# Patient Record
Sex: Male | Born: 1965 | Race: White | Hispanic: No | Marital: Single | Smoking: Current every day smoker
Health system: Southern US, Community
[De-identification: ages and names within clinical notes are randomized; demographics above are authoritative.]

## PROBLEM LIST (undated history)

## (undated) DIAGNOSIS — E785 Hyperlipidemia, unspecified: Secondary | ICD-10-CM

## (undated) DIAGNOSIS — I10 Essential (primary) hypertension: Secondary | ICD-10-CM

## (undated) DIAGNOSIS — F17210 Nicotine dependence, cigarettes, uncomplicated: Secondary | ICD-10-CM

## (undated) DIAGNOSIS — I714 Abdominal aortic aneurysm, without rupture, unspecified: Secondary | ICD-10-CM

## (undated) DIAGNOSIS — I745 Embolism and thrombosis of iliac artery: Secondary | ICD-10-CM

## (undated) DIAGNOSIS — I77810 Thoracic aortic ectasia: Secondary | ICD-10-CM

## (undated) DIAGNOSIS — Z9889 Other specified postprocedural states: Secondary | ICD-10-CM

## (undated) DIAGNOSIS — J449 Chronic obstructive pulmonary disease, unspecified: Secondary | ICD-10-CM

## (undated) DIAGNOSIS — K5720 Diverticulitis of large intestine with perforation and abscess without bleeding: Secondary | ICD-10-CM

## (undated) DIAGNOSIS — K760 Fatty (change of) liver, not elsewhere classified: Secondary | ICD-10-CM

## (undated) DIAGNOSIS — Z933 Colostomy status: Secondary | ICD-10-CM

## (undated) DIAGNOSIS — I7143 Infrarenal abdominal aortic aneurysm, without rupture: Secondary | ICD-10-CM

## (undated) DIAGNOSIS — K635 Polyp of colon: Secondary | ICD-10-CM

## (undated) HISTORY — DX: Hyperlipidemia, unspecified: E78.5

---

## 2005-04-29 ENCOUNTER — Inpatient Hospital Stay (HOSPITAL_COMMUNITY): Admission: EM | Admit: 2005-04-29 | Discharge: 2005-05-01 | Payer: Self-pay | Admitting: Emergency Medicine

## 2006-08-09 IMAGING — CR DG ANKLE COMPLETE 3+V*R*
3 series · 3 of 3 positions shown · non-contrast
Comparison: none

CLINICAL DATA: Fall.  Right ankle twisting injury with severe pain and swelling. 
RIGHT ANKLE ? 3 VIEW:

[view not recorded (1 of 3)]
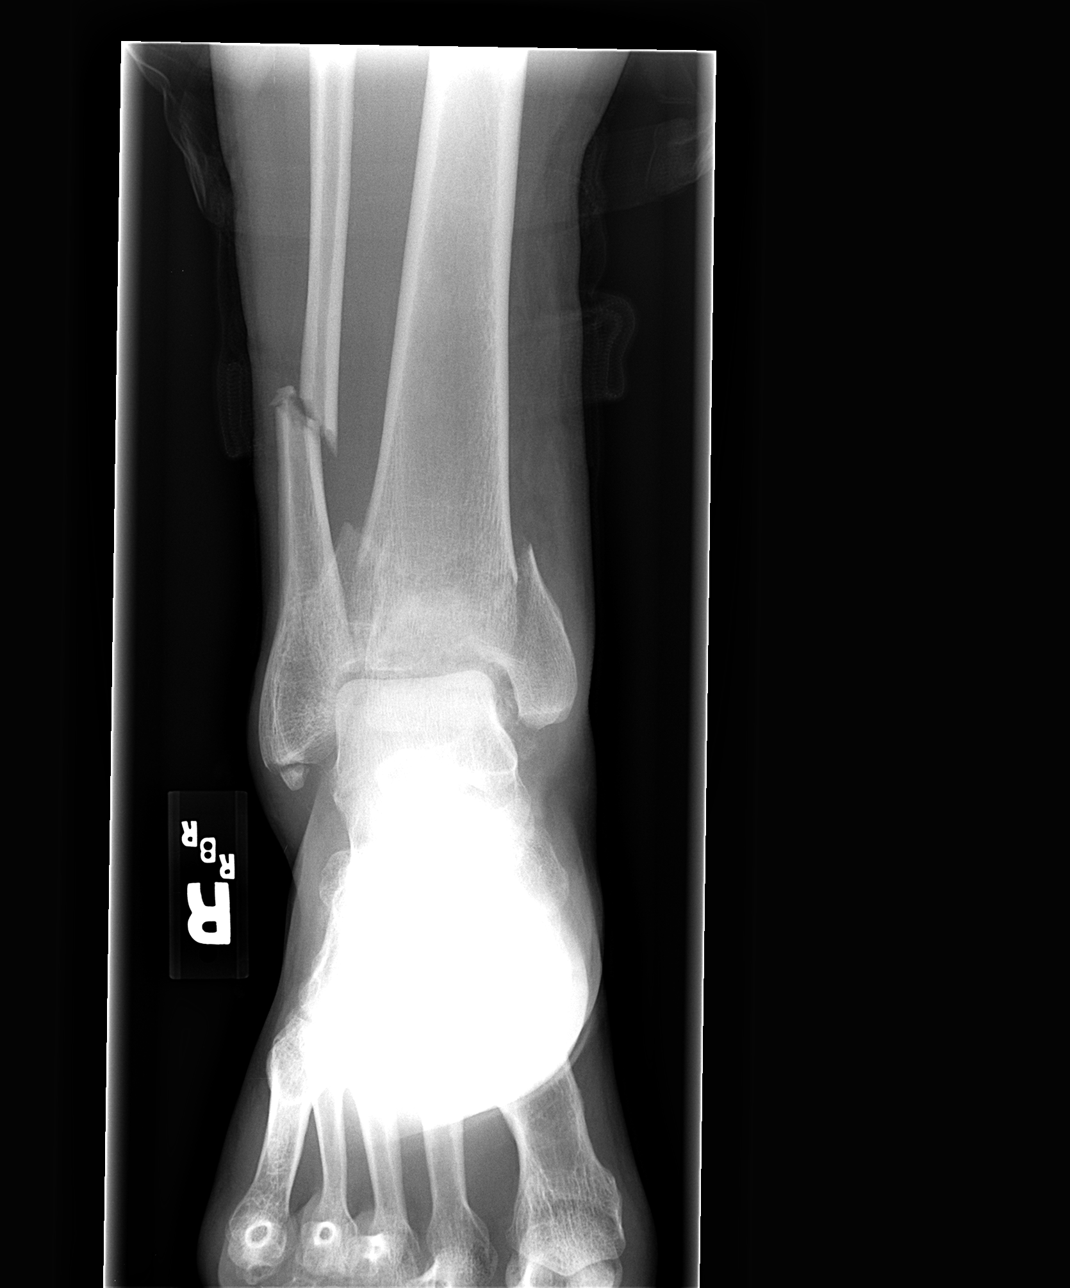

[view not recorded (2 of 3)]
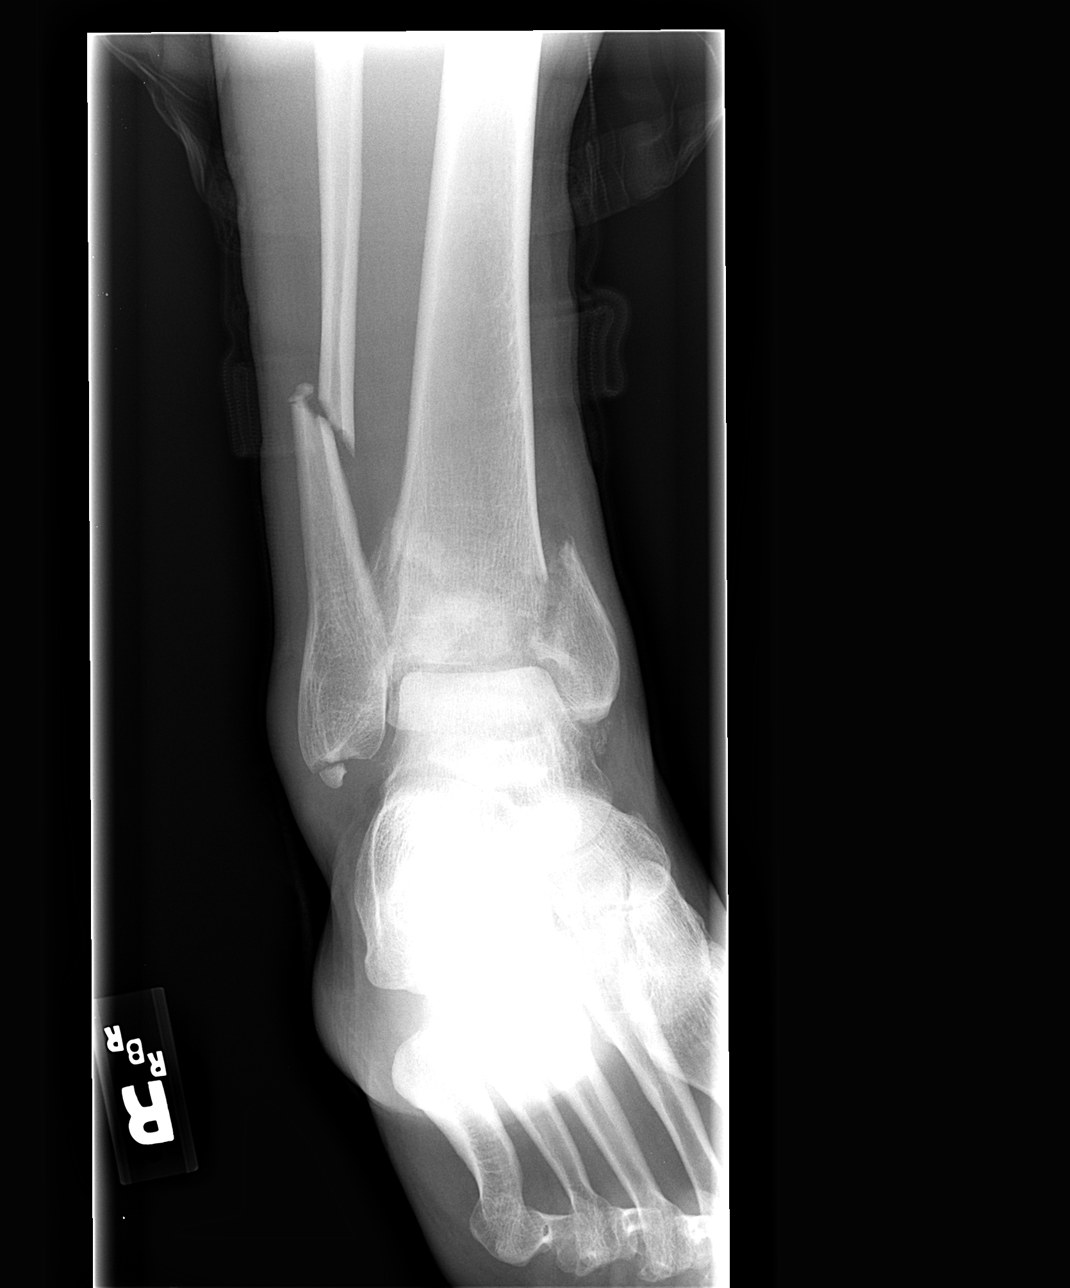

[view not recorded (3 of 3)]
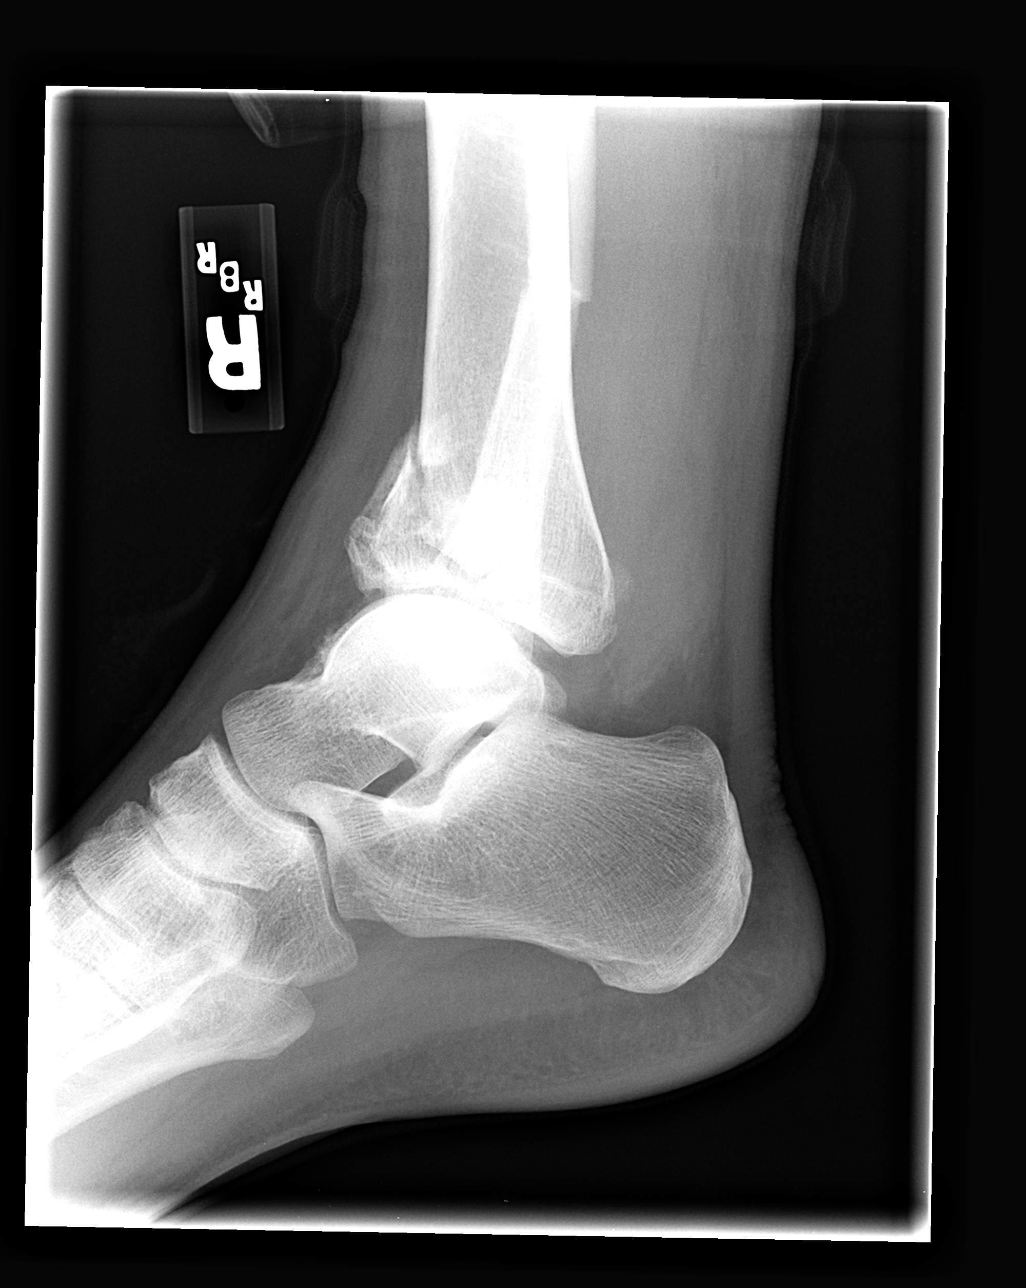

[3 of 3 positions shown; findings below may reference images not displayed]

FINDINGS: Impacted trimalleolar ankle fracture is seen with depression of the anterior articular surface of the distal tibia.  There is also an oblique fracture of the distal fibular shaft approximately 5 cm above the level of the tibial plafond with lateral displacement and anterior and medial angulation of the distal fibular fracture fragment.
IMPRESSION: 1.  Trimalleolar ankle fracture with impaction of the anterior articular surface of the distal tibia. 
2.  Oblique fracture of the distal fibula, approximately 5 cm superior to the tibial plafond.
RIGHT TIBIA/FIBULA ? 2 VIEW:
FINDINGS: A trimalleolar ankle fracture is seen.  There is also an oblique fracture of the distal fibular shaft approximately 5 cm above the level of the tibial plafond.
IMPRESSION: 1.  Oblique fracture of the distal fibula approximately 5 cm above level of the tibial plafond.
2.  Impacted trimalleolar ankle fracture.

## 2007-11-05 ENCOUNTER — Emergency Department (HOSPITAL_COMMUNITY): Admission: EM | Admit: 2007-11-05 | Discharge: 2007-11-05 | Payer: Self-pay | Admitting: Emergency Medicine

## 2010-09-30 NOTE — Op Note (Signed)
NAMEMarland Kitchen  NORI, POLAND NO.:  [id number]   MEDICAL RECORD NO.:  [id number]          PATIENT TYPE:  INP   LOCATION:  1827                         FACILITY:  [facility]   PHYSICIAN:  Claude Manges. Whitfield, M.D.DATE OF BIRTH:  1966/05/15   DATE OF PROCEDURE:  04/29/2005  DATE OF DISCHARGE:                                 OPERATIVE REPORT   PREOPERATIVE DIAGNOSIS:  Closed displaced right distal fibular fracture with  associated displaced impacted intra-articular fracture of the distal right  tibia and displaced medial malleolus fracture.   POSTOPERATIVE DIAGNOSIS:  Closed displaced right distal fibular fracture  with associated displaced impacted intra-articular fracture of the distal  right tibia and displaced medial malleolus fracture.   PROCEDURE:  Open reduction and internal fixation with bone grafting of  distal tibia.   SURGEON:  Claude Manges. Cleophas Dunker, M.D.   ASSISTANT:  Richardean Canal, P.A.   ANESTHESIA:  General orotracheal.   COMPLICATIONS:  None.   DESCRIPTION OF PROCEDURE:  With the patient comfortable on the operating  table and under general orotracheal anesthesia, the patient was placed in a  proximal right thigh tourniquet.  The leg was then prepped from the tips of  the toes to the knee with Betadine scrub and then DuraPrep.  Sterile draping  was performed.  With the extremity still elevated, it was Esmarch-  exsanguinated with a proximal tourniquet at 350 mmHg.   X-rays revealed a fracture of the distal fibula, perhaps 5 inches or so  proximal to the lateral malleolus.  There was also a very small avulsion  fracture of the lateral malleolus.  There was a displaced sheer fracture of  the medial malleolus and an intra-articular impaction fracture of the distal  tibia.   We first approached the lateral malleolus with a longitudinal incision  behind the lateral malleolus and via sharp dissection, carried down through  the subcutaneous tissue and via blunt  dissection, the soft tissue was  elevated off the fibula.  The periosteum was then elevated with a Glorious Peach so  we could visualize the fracture; it was short, oblique and comminuted.  Placing bone clamps on either side of the fracture and with distal traction,  it was reduced in anatomic position.  The anatomy of the fibula was such  that it was oblique in several planes and there was no flat edge and so  accordingly, I used a one-third tubular plate so that I could semi-bend the  plate to conform to the shape of the fibula; a 7-hole plate was utilized.  Four holes were placed above the fracture and 3 below after appropriate  drilling, measuring and filling with self-tapping cortical screws.  I had  very nice position and in fact, it was barely visible by image  intensification.  Wound was irrigated with saline solution.  Fascia was  closed with 3-0 Vicryl, subcu with the same material, skin closed with skin  clips.   A longitudinal incision was then made more than 7 cm in distance from the  lateral incision, starting along the distal tibia anteromedially, extending  more medially  over the medial malleolus.  Via sharp dissection, the incision  was carried down to the subcutaneous tissue.  Blunt dissection was then  performed.  We could visualize the sheer fracture of the medial malleolus;  this was retracted out of the operative field so that we could then  visualize the ankle joint.  The anterior half of the distal tibial articular  surface had been impacted and displaced perhaps at least a centimeter.  On  further visualization, there was an area of complete loss of articular  cartilage on the talus in a transverse fashion that was probably a  centimeter and a half to 2 cm in length.  The cartilage was eventually found  impacted in the cancellous bone of the distal tibia.  There was also a  displaced fracture of the anterolateral distal tibia.  Subperiosteal  dissection was performed  beneath the anterior skin flap so that we could  visualize the joint; it was irrigated for better visualization.  Using a  series of instruments, the impacted distal tibia was then disimpacted and  then placed anatomically over the talus, leaving a significant void of  cancellous bone and even cortical bone which had been displaced more  proximally.  A small stab wound was made anterolaterally and in protecting  the soft tissue, several guidepins were then inserted and cancellous screws  placed over the anterolateral tibial cortical bone to maintain good  position; I thought I had very nice position with image intensification.  The medial malleolar sheer fracture was then fixed with a K-wire and then 2  cortical screws with washers were then utilized after appropriate drilling  and measuring with the image intensification.  I had good purchase.  I had  difficulty fixing the lateral meniscus because there was little if any bone  in the very central position of the tibia to affix the screws and even the  tibial cortex laterally was comminuted, but I thought I had very nice  position.  I did leave the K-wire in place and clipped it at the cortical  margin, as I thought this was providing some stability.  The impacted distal  tibial bone that was devoid of cancellous bone was then filled with  cancellous allograft bone chips and then they were impacted, and then the  cortical bone that had been displaced from the original tibia was impacted  in place.  I irrigated the wound copiously with saline before this.   The tourniquet was deflated at about 2 hours and 10 minutes.  There was  immediate capillary refill to the toes and to all the skin edges.  The  medial wound was then closed with 3-0 Vicryl and then skin clips.  A sterile  bulky dressing was applied followed by posterior splints and coaptation  splints.  The patient tolerated the procedure well without complications.      Claude Manges.  Cleophas Dunker, M.D.  Electronically Signed     PWW/MEDQ  D:  04/29/2005  T:  05/02/2005  Job:  [id number]

## 2010-09-30 NOTE — Discharge Summary (Signed)
NAMEMarland Villarreal  FARZAD, TIBBETTS NO.:  [id number]   MEDICAL RECORD NO.:  [id number]          PATIENT TYPE:  INP   LOCATION:  5037                         FACILITY:  [facility]   PHYSICIAN:  Claude Manges. Whitfield, M.D.DATE OF BIRTH:  [birth year]   DATE OF ADMISSION:  04/29/2005  DATE OF DISCHARGE:  05/01/2005                                 DISCHARGE SUMMARY   ADMISSION DIAGNOSES:  1.  Right trimalleolar ankle fracture.  2.  Tobacco abuse.   DISCHARGE DIAGNOSES:  1.  Right trimalleolar ankle fracture.  2.  Tobacco abuse.   SURGICAL PROCEDURE:  On April 29, 2005, Jeremiah Villarreal underwent an open  reduction and internal fixation of a displaced distal right fibular fracture  with intra-articular displaced fracture off of the distal tibia and a  displaced medial malleolus fracture by Dr. Claude Manges. Whitfield, assisted by  Richardean Canal, P.A.-C.   COMPLICATIONS:  None.   CONSULTS:  1.  Cardiology consult was obtained on April 29, 2005.  2.  Physical Therapy and Occupational Therapy consults on April 29, 2005      and May 01, 2005.   HISTORY OF PRESENT ILLNESS:  This 45 year old white male fell when he was  trying to get into the shower 3 hours prior to admission.  He complained of  immediate right ankle pain and inability to bear weight.  He was brought to  the ER and found to have a displaced comminuted trimalleolar ankle fracture  with anterior subluxation of the talus and intra-articular fracture  extension.  The patient was admitted for surgical fixation.   HOSPITAL COURSE:  Jeremiah Villarreal tolerated the surgical procedure well without  immediate postoperative complications.  Preop cardiology consult was  obtained due to EKG reading that was problematic.  Repeat EKG was negative,  all cardiac enzymes were negative and he had no cardiac issues.  They did  sign off at that time.  On postop day 1, he was started on therapy per  protocol.  __________  all vitals stable.  He did  well.  On postop day 2, he  was ready for discharge home, ambulating non-weightbearing on that right leg  with use of crutches.  He will be discharged home later today.   DISCHARGE INSTRUCTIONS:   DIET:  He can resume his regular pre-hospitalization diet.   MEDICATIONS:  1.  Percocet 5/325 mg one to two tablets p.o. q.4 h. p.r.n. for pain, 50      with no refill.  2.  Robaxin 500 mg one to two tablets p.o. q.6 h. p.r.n. for spasm, 45 with      no refill.  3.  One baby aspirin a day for 6 weeks.  4.  He is encouraged to limit his smoking as much as possible to help with      fracture healing.   ACTIVITY:  He can be out of bed non-weightbearing on the right leg with the  use of crutches.  He is to keep his splint clean and dry, and not to take  any showers.  He is to elevate that  right leg above his heart as much as  possible and use ice as needed.  He is to notify Dr. Cleophas Dunker of  temperature greater than or equal to 101.5 degrees Fahrenheit, chills, pain  unrelieved by pain medications, calf pain or foul-smelling drainage from the  wound.   FOLLOWUP:  He is to follow up with Dr. Cleophas Dunker in our office next week,  probably Wednesday, May 10, 2005, and needs to call [phone] for that  appointment.   LABORATORY DATA:  Chest x-ray done on April 29, 2005 showed no active  cardiopulmonary disease.  Ankle films done at that time showed a  trimalleolar ankle fracture with impaction of the anterior articular surface  of the distal tibia and an oblique fracture of the distal fibula,  approximately 5 cm superior to the tibial plafond.   On April 29, 2005, pH was 7.302, PCO2 50.1, bicarb 24.8, PO2 26.   On April 29, 2005, white count 15.4; on the 17th, it was 10.9.  Hemoglobin was 17.2 with a hematocrit of 52 on the 16th.  On the 17th, it  was 14.2 and 40.5.  Glucose ranged from 105 on April 29, 2005 to 115 on  the 17th.  All other laboratory studies were within normal  limits.      Legrand Pitts Duffy, P.A.      Claude Manges. Cleophas Dunker, M.D.  Electronically Signed    KED/MEDQ  D:  05/01/2005  T:  05/03/2005  Job:  [id number]

## 2022-08-11 ENCOUNTER — Emergency Department
Admission: EM | Admit: 2022-08-11 | Discharge: 2022-08-11 | Disposition: A | Discharge status: Home / Self Care | Payer: BC Managed Care – PPO | Attending: Emergency Medicine | Admitting: Emergency Medicine

## 2022-08-11 ENCOUNTER — Other Ambulatory Visit: Payer: Self-pay

## 2022-08-11 DIAGNOSIS — S339XXA Sprain of unspecified parts of lumbar spine and pelvis, initial encounter: Secondary | ICD-10-CM | POA: Insufficient documentation

## 2022-08-11 DIAGNOSIS — X500XXA Overexertion from strenuous movement or load, initial encounter: Secondary | ICD-10-CM | POA: Insufficient documentation

## 2022-08-11 DIAGNOSIS — R1031 Right lower quadrant pain: Secondary | ICD-10-CM | POA: Insufficient documentation

## 2022-08-11 DIAGNOSIS — S335XXA Sprain of ligaments of lumbar spine, initial encounter: Secondary | ICD-10-CM

## 2022-08-11 DIAGNOSIS — F17200 Nicotine dependence, unspecified, uncomplicated: Secondary | ICD-10-CM | POA: Diagnosis not present

## 2022-08-11 DIAGNOSIS — D72829 Elevated white blood cell count, unspecified: Secondary | ICD-10-CM | POA: Diagnosis not present

## 2022-08-11 DIAGNOSIS — M545 Low back pain, unspecified: Secondary | ICD-10-CM | POA: Diagnosis present

## 2022-08-11 LAB — URINALYSIS, ROUTINE W REFLEX MICROSCOPIC
Bacteria, UA: NONE SEEN
Bilirubin Urine: NEGATIVE
Glucose, UA: NEGATIVE mg/dL
Hgb urine dipstick: NEGATIVE
Ketones, ur: NEGATIVE mg/dL
Leukocytes,Ua: NEGATIVE
Nitrite: NEGATIVE
Protein, ur: 100 mg/dL — AB
Specific Gravity, Urine: 1.02 (ref 1.005–1.030)
pH: 6 (ref 5.0–8.0)

## 2022-08-11 LAB — BASIC METABOLIC PANEL
Anion gap: 10 (ref 5–15)
BUN: 9 mg/dL (ref 6–20)
CO2: 27 mmol/L (ref 22–32)
Calcium: 9.6 mg/dL (ref 8.9–10.3)
Chloride: 103 mmol/L (ref 98–111)
Creatinine, Ser: 0.87 mg/dL (ref 0.61–1.24)
GFR, Estimated: >60 mL/min (ref >60)
Glucose, Bld: 120 mg/dL — ABNORMAL HIGH (ref 70–99)
Potassium: 4.4 mmol/L (ref 3.5–5.1)
Sodium: 140 mmol/L (ref 135–145)

## 2022-08-11 LAB — CBC
HCT: 56 % — ABNORMAL HIGH (ref 39.0–52.0)
Hemoglobin: 19.7 g/dL — ABNORMAL HIGH (ref 13.0–17.0)
MCH: 35.8 pg — ABNORMAL HIGH (ref 26.0–34.0)
MCHC: 35.2 g/dL (ref 30.0–36.0)
MCV: 101.6 fL — ABNORMAL HIGH (ref 80.0–100.0)
Platelets: 223 10*3/uL (ref 150–400)
RBC: 5.51 MIL/uL (ref 4.22–5.81)
RDW: 12.5 % (ref 11.5–15.5)
WBC: 11.7 10*3/uL — ABNORMAL HIGH (ref 4.0–10.5)
nRBC: 0 % (ref 0.0–0.2)

## 2022-08-11 MED ORDER — ACETAMINOPHEN 500 MG PO TABS
1000.0000 mg | ORAL_TABLET | Freq: Once | ORAL | Status: AC
  Administered 2022-08-11: 1000 mg via ORAL
  Filled 2022-08-11: qty 2

## 2022-08-11 MED ORDER — IBUPROFEN 400 MG PO TABS
400.0000 mg | ORAL_TABLET | Freq: Once | ORAL | Status: AC
  Administered 2022-08-11: 400 mg via ORAL
  Filled 2022-08-11: qty 1

## 2022-08-11 MED ORDER — OXYCODONE HCL 5 MG PO TABS
5.0000 mg | ORAL_TABLET | Freq: Once | ORAL | Status: AC
  Administered 2022-08-11: 5 mg via ORAL
  Filled 2022-08-11: qty 1

## 2022-08-11 MED ORDER — LIDOCAINE 5 % EX PTCH: 1.0000 | MEDICATED_PATCH | CUTANEOUS | 0 refills | Status: DC

## 2022-08-11 NOTE — ED Provider Notes (Addendum)
[facility] Provider Note    Event Date/Time   First MD Initiated Contact with Patient 08/11/22 1418     (approximate)   History   Flank Pain   HPI  Jeremiah Villarreal is a 57 y.o. male with no stated past medical history who presents with low back/flank pain.  Symptoms started on Tuesday, 4 days ago.  The day prior he had been helping his son move and was moving heavy things like a washer.  Pain is located in the right paraspinal region radiating around to the right hip and down to the right mid thigh.  Also needs down to the right groin.  Denies urinary symptoms.  Has had some diarrhea x 1 day.  No fevers has had some chills this morning.  Denies bowel bladder incontinence.  No numbness or weakness in his legs.  Pain feels worse when he lays on that side.  It improves when he gets up and walks.       No past medical history on file.  There are no problems to display for this patient.    Physical Exam  Triage Vital Signs: ED Triage Vitals  Enc Vitals Group     BP 08/11/22 1324 (!) 189/147     Pulse Rate 08/11/22 1324 89     Resp 08/11/22 1324 20     Temp 08/11/22 1324 97.6 F (36.4 C)     Temp Source 08/11/22 1324 Oral     SpO2 08/11/22 1324 100 %     Weight 08/11/22 1323 200 lb (90.7 kg)     Height 08/11/22 1323 6' (1.829 m)     Head Circumference --      Peak Flow --      Pain Score 08/11/22 1322 8     Pain Loc --      Pain Edu? --      Excl. in [place]? --     Most recent vital signs: Vitals:   08/11/22 1324 08/11/22 1513  BP: (!) 189/147 (!) 181/99  Pulse: 89 66  Resp: 20 16  Temp: 97.6 F (36.4 C)   SpO2: 100% 99%     General: Awake, no distress.  CV:  Good peripheral perfusion.  Resp:  Normal effort.  Abd:  No distention.  Mild tenderness in right lower quadrant, normal external GU exam no testicular swelling or pain, no inguinal hernia Neuro:             Awake, Alert, Oriented x 3  Other:  No midline lumbar tenderness, right low  paraspinal tenderness no CVA tenderness  Patient is able to ambulate full strength in lower extremities   ED Results / Procedures / Treatments  Labs (all labs ordered are listed, but only abnormal results are displayed) Labs Reviewed  CBC - Abnormal; Notable for the following components:      Result Value   WBC 11.7 (*)    Hemoglobin 19.7 (*)    HCT 56.0 (*)    MCV 101.6 (*)    MCH 35.8 (*)    All other components within normal limits  BASIC METABOLIC PANEL - Abnormal; Notable for the following components:   Glucose, Bld 120 (*)    All other components within normal limits  URINALYSIS, ROUTINE W REFLEX MICROSCOPIC - Abnormal; Notable for the following components:   Color, Urine YELLOW (*)    APPearance CLEAR (*)    Protein, ur 100 (*)    All other components within normal  limits     EKG     RADIOLOGY    PROCEDURES:  Critical Care performed: No  Procedures   MEDICATIONS ORDERED IN ED: Medications  ibuprofen (ADVIL) tablet 400 mg (400 mg Oral Given 08/11/22 1512)  acetaminophen (TYLENOL) tablet 1,000 mg (1,000 mg Oral Given 08/11/22 1512)  oxyCODONE (Oxy IR/ROXICODONE) immediate release tablet 5 mg (5 mg Oral Given 08/11/22 1512)     IMPRESSION / MDM / ASSESSMENT AND PLAN / ED COURSE  I reviewed the triage vital signs and the nursing notes.                              Patient's presentation is most consistent with acute complicated illness / injury requiring diagnostic workup.  Differential diagnosis includes, but is not limited to, muscle strain, abdominal wall strain, kidney stone, pyelonephritis, appendicitis, suspicion for AAA, dissection  The patient is a 57 year old male who presents with right low back pain rating to the abdomen since Tuesday.  This started the day after he was helping his son move and was lifting heavy boxes and a washer.  Pain is in the right hip/paraspinal region rating around to the right groin and lower abdomen.  He has no urinary  symptoms he has some chills today and has had diarrhea no fevers.  Of note he is about a week into taking clindamycin for an infected tooth.  Had about 3 episodes of diarrhea today.  Not had nausea or vomiting.  Patient is hypertensive vitals are otherwise reassuring.  On exam he does not have any midline lumbar tenderness or CVA tenderness.  He is tender over the right hip and low right paraspinal region.  On abdominal exam he does have some mild tenderness in the right lower quadrant/pelvic region without guarding in the abdomen is soft.  He is ambulatory with good strength in his lower extremities.  My suspicion given the pain is primarily in the low back and clearly worse with lying on the area, improved with walking/standing up and reproducible on palpation of the lumbar musculature and since it occurred after he was lifting I suspect that this is likely musculoskeletal.  I have low suspicion for appendicitis kidney stone or other acute potentially surgical process in the abdomen I think this is likely referred pain from the lumbar sacral area.  Patient's labs are notable for leukocytosis to 11.7 he is also polycythemic to 19.7, does smoke tobacco so this is likely the cause of this.  He has not had any vomiting does not look dry so I have low suspicion for volume depletion.  Urinalysis is clean without white cells or red cells.  BMP reassuring.  Plan to treat with Tylenol NSAIDs and rest and heat.  Discussed with the patient that if his abdominal pain in particular is worsening that he return to the ED as we did not evaluate for appendicitis or other intra-abdominal process given my suspicion that this is musculoskeletal.  Did discuss with patient that if he is continue to have diarrhea in several days he need to see his primary doctor to be tested for C. difficile given he is recently on clindamycin.  Patient not having diarrhea at this time to not able to test.  Patient given one-time dose of oxycodone  in the ED.          FINAL CLINICAL IMPRESSION(S) / ED DIAGNOSES   Final diagnoses:  Lumbar sprain, initial encounter  Rx / DC Orders   ED Discharge Orders          Ordered    lidocaine (LIDODERM) 5 %  Every 24 hours        08/11/22 1450             Note:  This document was prepared using Dragon voice recognition software and may include unintentional dictation errors.   Rada Hay, MD 08/11/22 1449    Rada Hay, MD 08/11/22 1451    Rada Hay, MD 08/11/22 Vernelle Emerald

## 2022-08-11 NOTE — ED Notes (Signed)
Pt states he drinks 4-5 beers every other day and last drink was yesterday. Pt states he has never withdrew from etoh.  Pt states he does not have any burning w/ urination or frequency changes.

## 2022-08-11 NOTE — Discharge Instructions (Addendum)
I suspect that your pain is musculoskeletal from lifting.  You can use a Lidoderm patch Tylenol and take ibuprofen or Motrin.  If your abdominal pain is worsening please return to the emergency department.  If you continue to have diarrhea in about 3 to 5 days please follow-up with your primary doctor as you will need to be tested for C. difficile.

## 2022-08-11 NOTE — ED Triage Notes (Signed)
Pt to ED for right flank pain radiating to right leg for 1 week. +chills, diarrhea, nausea. Denies urinary sx.  Pt appears uncomfortable in triage Pt unable to sit for long in triage, states needs to stand to feel better

## 2022-08-29 ENCOUNTER — Emergency Department: Payer: BC Managed Care – PPO

## 2022-08-29 ENCOUNTER — Emergency Department
Admission: EM | Admit: 2022-08-29 | Discharge: 2022-08-29 | Disposition: A | Discharge status: Home / Self Care | Payer: BC Managed Care – PPO | Attending: Emergency Medicine | Admitting: Emergency Medicine

## 2022-08-29 ENCOUNTER — Other Ambulatory Visit: Payer: Self-pay

## 2022-08-29 DIAGNOSIS — J189 Pneumonia, unspecified organism: Secondary | ICD-10-CM | POA: Diagnosis not present

## 2022-08-29 DIAGNOSIS — R42 Dizziness and giddiness: Secondary | ICD-10-CM

## 2022-08-29 DIAGNOSIS — Z1152 Encounter for screening for COVID-19: Secondary | ICD-10-CM | POA: Insufficient documentation

## 2022-08-29 DIAGNOSIS — R059 Cough, unspecified: Secondary | ICD-10-CM | POA: Diagnosis present

## 2022-08-29 DIAGNOSIS — I10 Essential (primary) hypertension: Secondary | ICD-10-CM

## 2022-08-29 LAB — RESP PANEL BY RT-PCR (RSV, FLU A&B, COVID)  RVPGX2
Influenza A by PCR: NEGATIVE
Influenza B by PCR: NEGATIVE
Resp Syncytial Virus by PCR: NEGATIVE
SARS Coronavirus 2 by RT PCR: NEGATIVE

## 2022-08-29 LAB — BASIC METABOLIC PANEL
Anion gap: 15 (ref 5–15)
BUN: 13 mg/dL (ref 6–20)
CO2: 23 mmol/L (ref 22–32)
Calcium: 9.5 mg/dL (ref 8.9–10.3)
Chloride: 102 mmol/L (ref 98–111)
Creatinine, Ser: 0.76 mg/dL (ref 0.61–1.24)
GFR, Estimated: >60 mL/min (ref >60)
Glucose, Bld: 104 mg/dL — ABNORMAL HIGH (ref 70–99)
Potassium: 4.1 mmol/L (ref 3.5–5.1)
Sodium: 140 mmol/L (ref 135–145)

## 2022-08-29 LAB — CBC
HCT: 54.2 % — ABNORMAL HIGH (ref 39.0–52.0)
Hemoglobin: 19 g/dL — ABNORMAL HIGH (ref 13.0–17.0)
MCH: 35.8 pg — ABNORMAL HIGH (ref 26.0–34.0)
MCHC: 35.1 g/dL (ref 30.0–36.0)
MCV: 102.3 fL — ABNORMAL HIGH (ref 80.0–100.0)
Platelets: 304 10*3/uL (ref 150–400)
RBC: 5.3 MIL/uL (ref 4.22–5.81)
RDW: 13 % (ref 11.5–15.5)
WBC: 9.5 10*3/uL (ref 4.0–10.5)
nRBC: 0 % (ref 0.0–0.2)

## 2022-08-29 LAB — LIPASE, BLOOD: Lipase: 25 U/L (ref 11–51)

## 2022-08-29 LAB — TROPONIN I (HIGH SENSITIVITY): Troponin I (High Sensitivity): 6 ng/L (ref <18)

## 2022-08-29 MED ORDER — AMLODIPINE BESYLATE 5 MG PO TABS: 5.0000 mg | ORAL_TABLET | Freq: Every day | ORAL | 2 refills | Status: AC

## 2022-08-29 MED ORDER — ONDANSETRON 4 MG PO TBDP: 4.0000 mg | ORAL_TABLET | Freq: Three times a day (TID) | ORAL | 0 refills | Status: DC | PRN

## 2022-08-29 MED ORDER — AMOXICILLIN 500 MG PO CAPS: 1000.0000 mg | ORAL_CAPSULE | Freq: Three times a day (TID) | ORAL | 0 refills | Status: AC

## 2022-08-29 NOTE — ED Provider Notes (Signed)
[facility] Provider Note    Event Date/Time   First MD Initiated Contact with Patient 08/29/22 1711     (approximate)   History   Chief Complaint Shortness of Breath and Dizziness   HPI  Jeremiah Villarreal is a 57 y.o. male with no significant past medical history who presents to the ED complaining of cough and dizziness.  Patient reports that he has had about 24 hours of cough, generalized weakness, malaise, dizziness, and nausea.  He has felt feverish with significant sweating and reports feeling consistently sick to his stomach, but has not had any vomiting, diarrhea, or abdominal pain.  He denies any pain in his chest or difficulty breathing, does report a productive cough, but denies any pain or swelling in his legs.  He reports feeling dizzy and lightheaded at times, but denies any feeling of the room spinning around him and has not had any numbness or weakness in his extremities.  He reports being told that his blood pressure is high when he has seen the doctor in the past, but he has never been started on medication for it.     Physical Exam   Triage Vital Signs: ED Triage Vitals  Enc Vitals Group     BP 08/29/22 1424 (!) 177/104     Pulse Rate 08/29/22 1424 64     Resp 08/29/22 1425 (!) 23     Temp 08/29/22 1425 97.6 F (36.4 C)     Temp src --      SpO2 08/29/22 1424 100 %     Weight 08/29/22 1424 198 lb 6.6 oz (90 kg)     Height 08/29/22 1424 6' (1.829 m)     Head Circumference --      Peak Flow --      Pain Score 08/29/22 1424 0     Pain Loc --      Pain Edu? --      Excl. in GC? --     Most recent vital signs: Vitals:   08/29/22 1424 08/29/22 1425  BP: (!) 177/104   Pulse: 64   Resp:  (!) 23  Temp:  97.6 F (36.4 C)  SpO2: 100%     Constitutional: Alert and oriented. Eyes: Conjunctivae are normal. Head: Atraumatic. Nose: No congestion/rhinnorhea. Mouth/Throat: Mucous membranes are moist.  Cardiovascular: Normal rate, regular  rhythm. Grossly normal heart sounds.  2+ radial pulses bilaterally. Respiratory: Normal respiratory effort.  No retractions. Lungs CTAB. Gastrointestinal: Soft and nontender. No distention. Musculoskeletal: No lower extremity tenderness nor edema.  Neurologic:  Normal speech and language. No gross focal neurologic deficits are appreciated.    ED Results / Procedures / Treatments   Labs (all labs ordered are listed, but only abnormal results are displayed) Labs Reviewed  CBC - Abnormal; Notable for the following components:      Result Value   Hemoglobin 19.0 (*)    HCT 54.2 (*)    MCV 102.3 (*)    MCH 35.8 (*)    All other components within normal limits  BASIC METABOLIC PANEL - Abnormal; Notable for the following components:   Glucose, Bld 104 (*)    All other components within normal limits  RESP PANEL BY RT-PCR (RSV, FLU A&B, COVID)  RVPGX2  LIPASE, BLOOD  TROPONIN I (HIGH SENSITIVITY)     EKG  ED ECG REPORT I, Jeremiah Villarreal, the attending physician, personally viewed and interpreted this ECG.   Date: 08/29/2022  EKG Time: 14:24  Rate: 74  Rhythm: normal sinus rhythm  Axis: Normal  Intervals:none  ST&T Change: None  RADIOLOGY Chest x-ray reviewed and interpreted by me with interstitial prominence, no focal infiltrate or effusion noted.  PROCEDURES:  Critical Care performed: No  Procedures   MEDICATIONS ORDERED IN ED: Medications - No data to display   IMPRESSION / MDM / ASSESSMENT AND PLAN / ED COURSE  I reviewed the triage vital signs and the nursing notes.                              [age]y.o. male with no significant past medical history presents to the ED with about 24 hours of malaise, dizziness, cough, and nausea.  Patient's presentation is most consistent with acute presentation with potential threat to life or bodily function.  Differential diagnosis includes, but is not limited to, arrhythmia, ACS, PE, pneumonia, pneumothorax, bronchitis,  COVID-19, influenza, dehydration, electrolyte abnormality, AKI, anemia, bronchospasm.  Patient nontoxic-appearing and in no acute distress, vital signs remarkable for hypertension but otherwise reassuring.  He is not in any respiratory distress and maintaining oxygen saturations at 100% on room air, lungs are clear to auscultation bilaterally.  Chest x-ray shows interstitial infiltrates representing atypical infection versus edema.  Low suspicion for pulmonary edema given patient has no clinical signs of CHF and his symptoms sound more consistent with infectious etiology.  Testing for COVID-19 and influenza is pending at this time, but will cover for bacterial pneumonia with antibiotics.  Vital signs do not appear concerning for sepsis and I do believe patient may be managed as an outpatient.  Additional labs are reassuring, EKG shows no evidence of arrhythmia or ischemia and troponin within normal limits, doubt ACS or PE.  No significant anemia, leukocytosis, electrolyte abnormality, or AKI noted.  Chest x-ray does show rib lesion, which patient states he has been told about previously.  Will provide referral to PCP and start patient on blood pressure medications, no evidence of hypertensive emergency at this time.  He was counseled to return to the ED for new or worsening symptoms, patient agrees with plan.      FINAL CLINICAL IMPRESSION(S) / ED DIAGNOSES   Final diagnoses:  Pneumonia of both lungs due to infectious organism, unspecified part of lung  Dizziness  Uncontrolled hypertension     Rx / DC Orders   ED Discharge Orders          Ordered    ondansetron (ZOFRAN-ODT) 4 MG disintegrating tablet  Every 8 hours PRN        08/29/22 1808    amoxicillin (AMOXIL) 500 MG capsule  3 times daily        08/29/22 1808    amLODipine (NORVASC) 5 MG tablet  Daily        08/29/22 1808    Ambulatory Referral to Primary Care (Establish Care)       Comments: Hypertension, pneumonia, rib lesion in  need of follow-up imaging   08/29/22 1808             Note:  This document was prepared using Dragon voice recognition software and may include unintentional dictation errors.   Jeremiah Noon, MD 08/29/22 [phone]

## 2022-08-29 NOTE — ED Triage Notes (Signed)
Pt to ED for shob, dizziness, emesis that started yesterday.

## 2022-08-29 NOTE — ED Notes (Signed)
Lab called in regard to CBC and BMP not resulting

## 2022-08-29 NOTE — ED Notes (Signed)
Pt discharge to home. Pt VSS, GCS 15, NAD. Pt verbalized understanding of discharge instructions with no additional questions at this time.  

## 2023-01-14 DIAGNOSIS — N321 Vesicointestinal fistula: Secondary | ICD-10-CM

## 2023-01-14 HISTORY — DX: Vesicointestinal fistula: N32.1

## 2023-01-19 ENCOUNTER — Inpatient Hospital Stay
Admission: EM | Admit: 2023-01-19 | Discharge: 2023-01-21 | DRG: 392 | Disposition: A | Discharge status: Home / Self Care | Payer: BC Managed Care – PPO | Attending: Internal Medicine | Admitting: Internal Medicine

## 2023-01-19 ENCOUNTER — Other Ambulatory Visit: Payer: Self-pay

## 2023-01-19 ENCOUNTER — Encounter: Payer: Self-pay | Admitting: Intensive Care

## 2023-01-19 ENCOUNTER — Emergency Department: Payer: BC Managed Care – PPO

## 2023-01-19 DIAGNOSIS — F17210 Nicotine dependence, cigarettes, uncomplicated: Secondary | ICD-10-CM | POA: Diagnosis present

## 2023-01-19 DIAGNOSIS — N321 Vesicointestinal fistula: Secondary | ICD-10-CM | POA: Diagnosis present

## 2023-01-19 DIAGNOSIS — I745 Embolism and thrombosis of iliac artery: Secondary | ICD-10-CM | POA: Diagnosis present

## 2023-01-19 DIAGNOSIS — Z79899 Other long term (current) drug therapy: Secondary | ICD-10-CM

## 2023-01-19 DIAGNOSIS — N400 Enlarged prostate without lower urinary tract symptoms: Secondary | ICD-10-CM | POA: Diagnosis present

## 2023-01-19 DIAGNOSIS — K5792 Diverticulitis of intestine, part unspecified, without perforation or abscess without bleeding: Secondary | ICD-10-CM | POA: Diagnosis present

## 2023-01-19 DIAGNOSIS — R319 Hematuria, unspecified: Secondary | ICD-10-CM | POA: Diagnosis present

## 2023-01-19 DIAGNOSIS — I10 Essential (primary) hypertension: Secondary | ICD-10-CM | POA: Diagnosis present

## 2023-01-19 DIAGNOSIS — I7143 Infrarenal abdominal aortic aneurysm, without rupture: Secondary | ICD-10-CM | POA: Diagnosis not present

## 2023-01-19 DIAGNOSIS — K632 Fistula of intestine: Secondary | ICD-10-CM | POA: Diagnosis present

## 2023-01-19 DIAGNOSIS — I719 Aortic aneurysm of unspecified site, without rupture: Secondary | ICD-10-CM | POA: Insufficient documentation

## 2023-01-19 DIAGNOSIS — I714 Abdominal aortic aneurysm, without rupture, unspecified: Secondary | ICD-10-CM | POA: Diagnosis present

## 2023-01-19 DIAGNOSIS — R3989 Other symptoms and signs involving the genitourinary system: Secondary | ICD-10-CM | POA: Diagnosis present

## 2023-01-19 DIAGNOSIS — R1032 Left lower quadrant pain: Secondary | ICD-10-CM | POA: Diagnosis present

## 2023-01-19 DIAGNOSIS — K5732 Diverticulitis of large intestine without perforation or abscess without bleeding: Secondary | ICD-10-CM | POA: Diagnosis present

## 2023-01-19 HISTORY — DX: Essential (primary) hypertension: I10

## 2023-01-19 LAB — CBC
HCT: 44.9 % (ref 39.0–52.0)
Hemoglobin: 15.6 g/dL (ref 13.0–17.0)
MCH: 35.3 pg — ABNORMAL HIGH (ref 26.0–34.0)
MCHC: 34.7 g/dL (ref 30.0–36.0)
MCV: 101.6 fL — ABNORMAL HIGH (ref 80.0–100.0)
Platelets: 379 K/uL (ref 150–400)
RBC: 4.42 MIL/uL (ref 4.22–5.81)
RDW: 12 % (ref 11.5–15.5)
WBC: 14.3 K/uL — ABNORMAL HIGH (ref 4.0–10.5)
nRBC: 0 % (ref 0.0–0.2)

## 2023-01-19 LAB — URINALYSIS, W/ REFLEX TO CULTURE (INFECTION SUSPECTED)
Bacteria, UA: NONE SEEN
Bilirubin Urine: NEGATIVE
Glucose, UA: NEGATIVE mg/dL
Ketones, ur: 5 mg/dL — AB
Nitrite: NEGATIVE
Protein, ur: NEGATIVE mg/dL
Specific Gravity, Urine: 1.018 (ref 1.005–1.030)
WBC, UA: >50 WBC/hpf (ref 0–5)
pH: 5 (ref 5.0–8.0)

## 2023-01-19 LAB — COMPREHENSIVE METABOLIC PANEL WITH GFR
ALT: 29 U/L (ref 0–44)
AST: 21 U/L (ref 15–41)
Albumin: 3.4 g/dL — ABNORMAL LOW (ref 3.5–5.0)
Alkaline Phosphatase: 96 U/L (ref 38–126)
Anion gap: 13 (ref 5–15)
BUN: 14 mg/dL (ref 6–20)
CO2: 25 mmol/L (ref 22–32)
Calcium: 9.1 mg/dL (ref 8.9–10.3)
Chloride: 100 mmol/L (ref 98–111)
Creatinine, Ser: 0.87 mg/dL (ref 0.61–1.24)
GFR, Estimated: >60 mL/min
Glucose, Bld: 110 mg/dL — ABNORMAL HIGH (ref 70–99)
Potassium: 4 mmol/L (ref 3.5–5.1)
Sodium: 138 mmol/L (ref 135–145)
Total Bilirubin: 1 mg/dL (ref 0.3–1.2)
Total Protein: 7.5 g/dL (ref 6.5–8.1)

## 2023-01-19 LAB — PROTIME-INR
INR: 1.1 (ref 0.8–1.2)
Prothrombin Time: 14 s (ref 11.4–15.2)

## 2023-01-19 LAB — TYPE AND SCREEN
ABO/RH(D): O POS
Antibody Screen: NEGATIVE

## 2023-01-19 MED ORDER — DEXTROSE IN LACTATED RINGERS 5 % IV SOLN: INTRAVENOUS | Status: DC

## 2023-01-19 MED ORDER — SODIUM CHLORIDE 0.9 % IV SOLN
2.0000 g | INTRAVENOUS | Status: DC
  Filled 2023-01-19: qty 20

## 2023-01-19 MED ORDER — PIPERACILLIN-TAZOBACTAM 3.375 G IVPB
3.3750 g | Freq: Three times a day (TID) | INTRAVENOUS | Status: DC
  Administered 2023-01-20 – 2023-01-21 (×4): 3.375 g via INTRAVENOUS
  Filled 2023-01-19 (×4): qty 50

## 2023-01-19 MED ORDER — MORPHINE SULFATE (PF) 2 MG/ML IV SOLN: 2.0000 mg | INTRAVENOUS | Status: DC | PRN

## 2023-01-19 MED ORDER — ONDANSETRON HCL 4 MG PO TABS: 4.0000 mg | ORAL_TABLET | Freq: Four times a day (QID) | ORAL | Status: DC | PRN

## 2023-01-19 MED ORDER — IOHEXOL 300 MG/ML  SOLN
100.0000 mL | Freq: Once | INTRAMUSCULAR | Status: AC | PRN
  Administered 2023-01-19: 100 mL via INTRAVENOUS

## 2023-01-19 MED ORDER — ENOXAPARIN SODIUM 40 MG/0.4ML IJ SOSY
40.0000 mg | PREFILLED_SYRINGE | INTRAMUSCULAR | Status: DC
  Administered 2023-01-19 – 2023-01-20 (×2): 40 mg via SUBCUTANEOUS
  Filled 2023-01-19 (×2): qty 0.4

## 2023-01-19 MED ORDER — METRONIDAZOLE 500 MG/100ML IV SOLN
500.0000 mg | Freq: Two times a day (BID) | INTRAVENOUS | Status: DC
  Filled 2023-01-19: qty 100

## 2023-01-19 MED ORDER — AMLODIPINE BESYLATE 5 MG PO TABS
5.0000 mg | ORAL_TABLET | Freq: Every day | ORAL | Status: DC
  Administered 2023-01-19 – 2023-01-21 (×3): 5 mg via ORAL
  Filled 2023-01-19 (×3): qty 1

## 2023-01-19 MED ORDER — PIPERACILLIN-TAZOBACTAM 3.375 G IVPB 30 MIN
3.3750 g | Freq: Once | INTRAVENOUS | Status: AC
  Administered 2023-01-19: 3.375 g via INTRAVENOUS
  Filled 2023-01-19: qty 50

## 2023-01-19 MED ORDER — SODIUM CHLORIDE 0.9 % IV BOLUS
1000.0000 mL | Freq: Once | INTRAVENOUS | Status: AC
  Administered 2023-01-19: 1000 mL via INTRAVENOUS

## 2023-01-19 MED ORDER — IRBESARTAN 150 MG PO TABS
300.0000 mg | ORAL_TABLET | Freq: Every day | ORAL | Status: DC
  Administered 2023-01-20 – 2023-01-21 (×2): 300 mg via ORAL
  Filled 2023-01-19 (×2): qty 2

## 2023-01-19 MED ORDER — ONDANSETRON HCL 4 MG/2ML IJ SOLN: 4.0000 mg | Freq: Four times a day (QID) | INTRAMUSCULAR | Status: DC | PRN

## 2023-01-19 NOTE — ED Notes (Signed)
Patient is reclining on stretcher. Patient is aware of NPO status. Room darkened for patient's comfort.

## 2023-01-19 NOTE — ED Notes (Signed)
Patient is irritable, states he is hungry and wants to go home. Dr. Vicente Males aware.

## 2023-01-19 NOTE — ED Notes (Signed)
Transporters are here to take patient to floor. Patient is alert and oriented, calm and cooperative.

## 2023-01-19 NOTE — ED Notes (Signed)
Request made for transport to the floor ?

## 2023-01-19 NOTE — ED Triage Notes (Signed)
Patient c/o abdominal pain X3 days. Reports he had an episode two weeks ago where he had some blood in urine but none since. Today, he reports three black drops that came from his penis after peeing.   Denies N/V/D  Last BM this AM

## 2023-01-19 NOTE — H&P (Signed)
History and Physical    Patient: Jeremiah Villarreal MVH:[id number] DOB: [birth year] DOA: 01/19/2023 DOS: the patient was seen and examined on 01/19/2023 PCP: Pcp, No  Patient coming from: Home  Chief Complaint:  Chief Complaint  Patient presents with   Abdominal Pain   HPI: Jeremiah Villarreal is a 57 y.o. male with medical history significant of essential hypertension, BPH who presents to the ER with abdominal pain.  Patient also has history of tobacco abuse otherwise hemodynamically stable.  Presented with the pain that is rated as a 9 out of 10.  Symptoms have been going on for the last 3 days.  He had a similar episode about 2 weeks ago where he has some blood also in urine but none since.  Today he noticed about 3 black drops that came from his penis also after peeing.  Abdominal pain however is what brought him to the ER.  He denied hematemesis no melena no bright red blood per rectum.  Patient was seen and evaluated in the ER.  Workup shows acute diverticulitis.  Patient is being admitted to the hospital for further evaluation and treatment.  He denied any fever or chills.  Review of Systems: As mentioned in the history of present illness. All other systems reviewed and are negative. Past Medical History:  Diagnosis Date   Hypertension    History reviewed. No pertinent surgical history. Social History:  reports that he has been smoking cigarettes. He started smoking about 42 years ago. He has a 42.7 pack-year smoking history. He has never used smokeless tobacco. He reports current alcohol use of about 6.0 standard drinks of alcohol per week. He reports that he does not use drugs.  No Known Allergies  History reviewed. No pertinent family history.  Prior to Admission medications   Medication Sig Start Date End Date Taking? Authorizing Provider  amLODipine (NORVASC) 5 MG tablet Take 1 tablet (5 mg total) by mouth daily. 08/29/22 01/19/23 Yes Chesley Noon, MD  olmesartan (BENICAR) 40 MG tablet Take 40  mg by mouth daily. 01/05/23 01/05/24 Yes [provider]  lidocaine (LIDODERM) 5 % Place 1 patch onto the skin daily. Remove & Discard patch within 12 hours or as directed by MD Patient not taking: Reported on 01/19/2023 08/11/22   Georga Hacking, MD  olmesartan-hydrochlorothiazide (BENICAR HCT) 40-12.5 MG tablet Take 1 tablet by mouth daily. Patient not taking: Reported on 01/19/2023 12/25/22   [provider]  ondansetron (ZOFRAN-ODT) 4 MG disintegrating tablet Take 1 tablet (4 mg total) by mouth every 8 (eight) hours as needed for nausea or vomiting. Patient not taking: Reported on 01/19/2023 08/29/22   Chesley Noon, MD  tamsulosin (FLOMAX) 0.4 MG CAPS capsule Take 0.4 mg by mouth daily. Patient not taking: Reported on 01/19/2023 12/18/22   [provider]    Physical Exam: Vitals:   01/19/23 1257 01/19/23 1258 01/19/23 1938 01/19/23 2013  BP: (!) 150/93  (!) 144/94 (!) 166/93  Pulse: 96  66 69  Resp: 16  17 20   Temp: 99 F (37.2 C)  97.6 F (36.4 C) (!) 97.3 F (36.3 C)  TempSrc: Oral  Oral Oral  SpO2: 98%  99% 100%  Weight:  85.7 kg    Height:  6\' 1"  (1.854 m)     Constitutional: Acutely ill looking, NAD, calm, comfortable Eyes: PERRL, lids and conjunctivae normal ENMT: Mucous membranes are dry. Posterior pharynx clear of any exudate or lesions.Normal dentition.  Neck: normal, supple, no masses, no thyromegaly Respiratory:  clear to auscultation bilaterally, no wheezing, no crackles. Normal respiratory effort. No accessory muscle use.  Cardiovascular: Regular rate and rhythm, no murmurs / rubs / gallops. No extremity edema. 2+ pedal pulses. No carotid bruits.  Abdomen: Diffuse abdominal tenderness, no masses palpated. No hepatosplenomegaly. Bowel sounds positive.  Musculoskeletal: Good range of motion, no joint swelling or tenderness, Skin: no rashes, lesions, ulcers. No induration Neurologic: CN 2-12 grossly intact. Sensation intact, DTR normal. Strength 5/5  in all 4.  Psychiatric: Normal judgment and insight. Alert and oriented x 3. Normal mood  Data Reviewed:  Glucose is 110 albumin 3.4.  White count 14.3.  Urinalysis showed moderate hemoglobin and large leukocytes WBC more than 50.  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  He appears to have multiple intra-abdominal fistula based on the CT findings.  He probably has minimal hematuria with his complaint of hematuria in his urine.  Patient most likely will require surgical intervention.  Both general surgery and urology may need to be involved.  In the meantime admit the patient.  Bowel rest.  IV Rocephin and Flagyl.  Supportive care.  #2 essential hypertension: Resume home regimen of ARB and amlodipine.  #3 abdominal aortic aneurysm: Patient will need outpatient follow-up.  It is about 5.1 cm.  #4 leukocytosis: Most likely secondary to the diverticulitis.  Continue to monitor.  #5 possible UTI: Patient has hematuria and leukocytosis.  May be related to the colovesical fistula suspected.    Advance Care Planning:   Code Status: Full Code   Consults: Will consult surgery  Family Communication: No family at bedside  Severity of Illness: The appropriate patient status for this patient is INPATIENT. Inpatient status is judged to be reasonable and necessary in order to provide the required intensity of service to ensure the patient's safety. The patient's presenting symptoms, physical exam findings, and initial radiographic and laboratory data in the context of their chronic comorbidities is felt to place them at high risk for further clinical  deterioration. Furthermore, it is not anticipated that the patient will be medically stable for discharge from the hospital within 2 midnights of admission.   * I certify that at the point of admission it is my clinical judgment that the patient will require inpatient hospital care spanning beyond 2 midnights from the point of admission due to high intensity of service, high risk for further deterioration and high frequency of surveillance required.*  AuthorLonia Blood, MD 01/19/2023 8:43 PM  For on call review [email or web address].

## 2023-01-19 NOTE — ED Provider Notes (Signed)
[facility] Provider Note   Event Date/Time   First MD Initiated Contact with Patient 01/19/23 1415     (approximate) History  Abdominal Pain  HPI Jeremiah Villarreal is a 57 y.o. male with a past medical history of hypertension and tobacco abuse who presents complaining of left lower quadrant abdominal pain with hematuria as well as passing what patient describes as "dark stones" from his penis today.  Patient states that he was in the shower when he needed to urinate 8.  Patient then noticed 3 small brown "stones" that were not outlined by the water in the shower too red or brown/black.  Patient states that he has been having this left lower quadrant abdominal pain over the past week as well intermittently over the last few weeks.   ROS: Patient currently denies any vision changes, tinnitus, difficulty speaking, facial droop, sore throat, chest pain, shortness of breath, nausea/vomiting/diarrhea, or weakness/numbness/paresthesias in any extremity   Physical Exam  Triage Vital Signs: ED Triage Vitals  Encounter Vitals Group     BP 01/19/23 1257 (!) 150/93     Systolic BP Percentile --      Diastolic BP Percentile --      Pulse Rate 01/19/23 1257 96     Resp 01/19/23 1257 16     Temp 01/19/23 1257 99 F (37.2 C)     Temp Source 01/19/23 1257 Oral     SpO2 01/19/23 1257 98 %     Weight 01/19/23 1258 189 lb (85.7 kg)     Height 01/19/23 1258 6\' 1"  (1.854 m)     Head Circumference --      Peak Flow --      Pain Score 01/19/23 1258 5     Pain Loc --      Pain Education --      Exclude from Growth Chart --    Most recent vital signs: Vitals:   01/19/23 1257  BP: (!) 150/93  Pulse: 96  Resp: 16  Temp: 99 F (37.2 C)  SpO2: 98%   General: Awake, oriented x4. CV:  Good peripheral perfusion.  Resp:  Normal effort.  Abd:  Soft.  No distention.  Left lower quadrant tenderness to palpation.   Other:  Middle-aged overweight Caucasian male resting comfortably in no  acute distress ED Results / Procedures / Treatments  Labs (all labs ordered are listed, but only abnormal results are displayed) Labs Reviewed  COMPREHENSIVE METABOLIC PANEL - Abnormal; Notable for the following components:      Result Value   Glucose, Bld 110 (*)    Albumin 3.4 (*)    All other components within normal limits  CBC - Abnormal; Notable for the following components:   WBC 14.3 (*)    MCV 101.6 (*)    MCH 35.3 (*)    All other components within normal limits  URINALYSIS, W/ REFLEX TO CULTURE (INFECTION SUSPECTED) - Abnormal; Notable for the following components:   Color, Urine YELLOW (*)    APPearance CLOUDY (*)    Hgb urine dipstick MODERATE (*)    Ketones, ur 5 (*)    Leukocytes,Ua LARGE (*)    All other components within normal limits  URINE CULTURE  PROTIME-INR  POC OCCULT BLOOD, ED  TYPE AND SCREEN  RADIOLOGY ED MD interpretation: CT of the abdomen and pelvis with IV contrast interpreted independently by me and shows acute on chronic sigmoid diverticulitis with multiple fistulous connections in the left lower quadrant,  including a fistulous connection to the anterior abdominal wall, and likely colocolonic fistula, and a colovesicular fistula -Agree with radiology assessment Official radiology report(s): CT ABDOMEN PELVIS W CONTRAST  Result Date: 01/19/2023 CLINICAL DATA:  Abdominal pain, acute, nonlocalized hematuria w/ dyuria as well as LLQ pain and Left flank pain EXAM: CT ABDOMEN AND PELVIS WITH CONTRAST TECHNIQUE: Multidetector CT imaging of the abdomen and pelvis was performed using the standard protocol following bolus administration of intravenous contrast. RADIATION DOSE REDUCTION: This exam was performed according to the departmental dose-optimization program which includes automated exposure control, adjustment of the mA and/or kV according to patient size and/or use of iterative reconstruction technique. CONTRAST:  OMNIPAQUE IOHEXOL 300 MG/ML  SOLN  COMPARISON:  None Available. FINDINGS: Lower chest: Right basilar subpleural reticulations, possibly related to chronic aspiration. Hepatobiliary: Hepatic steatosis. No focal liver lesions are visualized. There is focal fatty sparing around the gallbladder fossa. No evidence of cholelithiasis or cholecystitis. Portal veins are contrast opacify. Pancreas: No evidence of peripancreatic fat stranding to suggest pancreatitis. No evidence of pancreatic ductal dilatation. Spleen: Normal in size without focal abnormality. Adrenals/Urinary Tract: Bilateral adrenal glands are normal in appearance. Bilateral kidneys are normal in size without evidence hydronephrosis or nephrolithiasis. There is focal wall thickening anteriorly along the dome of the bladder (series 2, image 72). There is also a small locule of air along the anti dependent portion of the bladder. There is soft tissue stranding surrounding this area of wall thickening. Stomach/Bowel: There is diverticulosis with evidence of acute on chronic sigmoid diverticulitis. 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 The appendix is normal in appearance. Vascular/Lymphatic: There is an infrarenal abdominal aortic aneurysm which extends into the right common iliac artery (series 2, image 59). The infrarenal component of the aneurysm measures up to 5.1 x 4.5 cm. The component extending into the right external iliac artery measures up to 2.6 x 3.6 cm. 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 Reproductive: Prostate is unremarkable. Other: No abdominal wall hernia or abnormality. No abdominopelvic ascites. Musculoskeletal: Bilateral pars defects at L5. IMPRESSION: 1. 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 2. Infrarenal abdominal aortic aneurysm which extends into the right common iliac artery. The infrarenal component of the aneurysm measures up to 5.1 x 4.5 cm. The component extending into the right external iliac artery measures up to 2.6 x 3.6 cm. Recommend vascular surgery consultation. 3. 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 4. Hepatic steatosis. Electronically Signed   By: Lorenza Cambridge M.D.   On: 01/19/2023 18:19   PROCEDURES: Critical Care performed: Yes, see critical care procedure note(s) .1-3 Lead EKG Interpretation  Performed by: Merwyn Katos, MD Authorized by: Merwyn Katos, MD     Interpretation: normal     ECG rate:  91   ECG rate assessment: normal     Rhythm: sinus rhythm     Ectopy: none     Conduction: normal   CRITICAL CARE Performed by: Merwyn Katos  Total critical care time: 58  minutes  Critical care time was exclusive of separately billable procedures and treating other patients.  Critical care was necessary to treat or prevent imminent or life-threatening deterioration.  Critical care was time spent personally by me on the following activities: development of treatment plan with patient and/or surrogate as well as nursing, discussions with consultants, evaluation of patient's response to treatment, examination of patient, obtaining history from patient or surrogate, ordering and performing treatments and interventions, ordering and review of laboratory studies, ordering and review of radiographic studies, pulse oximetry and re-evaluation of patient's condition.  MEDICATIONS ORDERED IN ED: Medications  iohexol (OMNIPAQUE) 300 MG/ML solution 100 mL (100 mLs Intravenous Contrast Given 01/19/23  1623)  sodium chloride 0.9 % bolus 1,000 mL (1,000 mLs Intravenous New Bag/Given 01/19/23 1836)  piperacillin-tazobactam (ZOSYN) IVPB 3.375 g (3.375 g Intravenous New Bag/Given 01/19/23 1837)   IMPRESSION / MDM / ASSESSMENT AND PLAN / ED COURSE  I reviewed the triage vital signs and the nursing notes.                             The patient is on the cardiac monitor to evaluate for evidence of arrhythmia and/or significant heart rate changes. Patient's presentation is most consistent with acute presentation with potential threat to life or bodily function.  This patient presents to the ED for concern of left lower quadrant abdominal pain, dysuria, hematuria, this involves an extensive number of treatment options, and is a complaint that carries with it a high risk of complications and morbidity.  The differential diagnosis includes UTI, urosepsis, diverticulitis, colitis, kidney stone Co morbidities that complicate the patient evaluation  Hypertension, tobacco abuse Additional history obtained:  External records from outside source obtained and reviewed including office visit at [facility] clinic today Lab Tests:  I Ordered, and personally interpreted labs.  The pertinent results include: WBC 14, UA showing cloudy urine with moderate hemoglobin and large leukocytes Imaging Studies ordered:  I ordered imaging studies including CT of the abdomen and pelvis  I independently visualized and interpreted imaging which showed acute on chronic sigmoid diverticulitis with multiple fistulous connections described above  I agree with the radiologist interpretation Cardiac Monitoring: / EKG:  The patient was maintained on a cardiac monitor.  I personally viewed and interpreted the cardiac monitored which showed an underlying rhythm of: Normal sinus rhythm Consultations Obtained:  I requested consultation with the general surgery team,  and discussed lab and imaging findings as well as pertinent plan -  they recommend: Admit to medicine Problem List / ED Course / Critical interventions / Medication management  Acute on chronic sigmoid diverticulitis  I ordered medication including Zosyn for intra-abdominal infection  Reevaluation of the patient after these medicines showed that the patient stayed the same  I have reviewed the patients home medicines and have made adjustments as needed  Dispo: Admit to medicine with general surgery following       FINAL CLINICAL IMPRESSION(S) / ED DIAGNOSES   Final diagnoses:  Left lower quadrant abdominal pain  Sigmoid diverticulitis  Colovesical fistula   Rx / DC Orders   ED Discharge Orders     None      Note:  This document was prepared using Dragon voice recognition software and may include unintentional dictation errors.   Merwyn Katos, MD 01/19/23 [phone]

## 2023-01-19 NOTE — Progress Notes (Signed)
Pharmacy Antibiotic Note  Jeremiah Villarreal is a 57 y.o. male admitted on 01/19/2023 with intra-abdominal infection.  Pharmacy has been consulted for ZOsyn dosing.  Plan: Zosyn 3.375g IV q8h (4 hour infusion).  Pharmacy will continue to follow and will adjust abx dosing whenever warranted.  Temp (24hrs), Avg:98 F (36.7 C), Min:97.3 F (36.3 C), Max:99 F (37.2 C)   Recent Labs  Lab 01/19/23 1307  WBC 14.3*  CREATININE 0.87    Estimated Creatinine Clearance: 103.3 mL/min (by C-G formula based on SCr of 0.87 mg/dL).    No Known Allergies  Antimicrobials this admission: 9/06 Zosyn >>   Microbiology results: 9/06 UCx: No result   Thank you for allowing pharmacy to be a part of this patient's care.  Otelia Sergeant, PharmD, [facility] 01/19/2023 10:56 PM

## 2023-01-20 DIAGNOSIS — K632 Fistula of intestine: Secondary | ICD-10-CM | POA: Diagnosis not present

## 2023-01-20 DIAGNOSIS — K5792 Diverticulitis of intestine, part unspecified, without perforation or abscess without bleeding: Secondary | ICD-10-CM | POA: Diagnosis not present

## 2023-01-20 DIAGNOSIS — N321 Vesicointestinal fistula: Secondary | ICD-10-CM | POA: Diagnosis not present

## 2023-01-20 DIAGNOSIS — I719 Aortic aneurysm of unspecified site, without rupture: Secondary | ICD-10-CM | POA: Insufficient documentation

## 2023-01-20 DIAGNOSIS — I745 Embolism and thrombosis of iliac artery: Secondary | ICD-10-CM | POA: Insufficient documentation

## 2023-01-20 DIAGNOSIS — I10 Essential (primary) hypertension: Secondary | ICD-10-CM | POA: Diagnosis not present

## 2023-01-20 LAB — URINE CULTURE: Culture: NO GROWTH

## 2023-01-20 LAB — CBC
HCT: 39.8 % (ref 39.0–52.0)
Hemoglobin: 14.2 g/dL (ref 13.0–17.0)
MCH: 35.7 pg — ABNORMAL HIGH (ref 26.0–34.0)
MCHC: 35.7 g/dL (ref 30.0–36.0)
MCV: 100 fL (ref 80.0–100.0)
Platelets: 343 10*3/uL (ref 150–400)
RBC: 3.98 MIL/uL — ABNORMAL LOW (ref 4.22–5.81)
RDW: 11.9 % (ref 11.5–15.5)
WBC: 9.5 10*3/uL (ref 4.0–10.5)
nRBC: 0 % (ref 0.0–0.2)

## 2023-01-20 LAB — COMPREHENSIVE METABOLIC PANEL
ALT: 24 U/L (ref 0–44)
AST: 20 U/L (ref 15–41)
Albumin: 3.1 g/dL — ABNORMAL LOW (ref 3.5–5.0)
Alkaline Phosphatase: 83 U/L (ref 38–126)
Anion gap: 9 (ref 5–15)
BUN: 9 mg/dL (ref 6–20)
CO2: 25 mmol/L (ref 22–32)
Calcium: 8.1 mg/dL — ABNORMAL LOW (ref 8.9–10.3)
Chloride: 105 mmol/L (ref 98–111)
Creatinine, Ser: 0.64 mg/dL (ref 0.61–1.24)
GFR, Estimated: >60 mL/min (ref >60)
Glucose, Bld: 122 mg/dL — ABNORMAL HIGH (ref 70–99)
Potassium: 3.5 mmol/L (ref 3.5–5.1)
Sodium: 139 mmol/L (ref 135–145)
Total Bilirubin: 0.4 mg/dL (ref 0.3–1.2)
Total Protein: 6.4 g/dL — ABNORMAL LOW (ref 6.5–8.1)

## 2023-01-20 LAB — HIV ANTIBODY (ROUTINE TESTING W REFLEX): HIV Screen 4th Generation wRfx: NONREACTIVE

## 2023-01-20 MED ORDER — POTASSIUM CHLORIDE CRYS ER 20 MEQ PO TBCR: 40.0000 meq | EXTENDED_RELEASE_TABLET | Freq: Once | ORAL | Status: DC

## 2023-01-20 MED ORDER — KCL IN DEXTROSE-NACL 20-5-0.45 MEQ/L-%-% IV SOLN
INTRAVENOUS | Status: DC
  Filled 2023-01-20 (×3): qty 1000

## 2023-01-20 MED ORDER — ORAL CARE MOUTH RINSE: 15.0000 mL | OROMUCOSAL | Status: DC | PRN

## 2023-01-20 MED ORDER — NICOTINE 21 MG/24HR TD PT24
21.0000 mg | MEDICATED_PATCH | Freq: Every day | TRANSDERMAL | Status: DC
  Administered 2023-01-20 – 2023-01-21 (×2): 21 mg via TRANSDERMAL
  Filled 2023-01-20 (×2): qty 1

## 2023-01-20 NOTE — Progress Notes (Signed)
  Progress Note   Patient: Jeremiah Villarreal ZOX:[id number] DOB: 1965/05/27 DOA: 01/19/2023     1 DOS: the patient was seen and examined on 01/20/2023   Brief hospital course: Jeremiah Villarreal is a 57 y.o. male with medical history significant of essential hypertension, BPH who presents to the ER with abdominal pain.  Patient does not have leukocytosis.  However CT scan showed diverticulitis with multiple fistulous including colonic fistula and colovesical fistula. Patient does not have any evidence of acute abdomen, he was started on Zosyn.  General surgery consult obtained.   Principal Problem:   Acute diverticulitis Active Problems:   Essential hypertension   AAA (abdominal aortic aneurysm) (HCC)   Colonic fistula   Aortic aneurysm (HCC)   Iliac artery occlusion, right (HCC)   Assessment and Plan: Acute diverticulitis with colonic fistula. Patient does not have sepsis.  Patient has colonic fistula associated with acute diverticulitis, but no clinical signs of peritonitis. Patient is a followed by general surgery, continue Zosyn for now. Patient has abnormal urine study, reflecting colovesicular fistula.  Culture sent out.  Essential hypertension.   Continue home medicines.  Abdominal aortic aneurysm. Iliac artery occlusion on the right. Follow-up with vascular surgery as outpatient.       Subjective:  Patient feels much better, no abdominal pain or nausea vomiting today.  Physical Exam: Vitals:   01/19/23 1938 01/19/23 2013 01/20/23 0325 01/20/23 0933  BP: (!) 144/94 (!) 166/93 121/77 135/81  Pulse: 66 69 65 62  Resp: 17 20 18 18   Temp: 97.6 F (36.4 C) (!) 97.3 F (36.3 C) 97.9 F (36.6 C) 97.6 F (36.4 C)  TempSrc: Oral Oral  Oral  SpO2: 99% 100% 99% 99%  Weight:      Height:       General exam: Appears calm and comfortable  Respiratory system: Clear to auscultation. Respiratory effort normal. Cardiovascular system: S1 & S2 heard, RRR. No JVD, murmurs, rubs, gallops or  clicks. No pedal edema. Gastrointestinal system: Abdomen is nondistended, soft and nontender. No organomegaly or masses felt. Normal bowel sounds heard. Central nervous system: Alert and oriented. No focal neurological deficits. Extremities: Symmetric 5 x 5 power. Skin: No rashes, lesions or ulcers Psychiatry: Judgement and insight appear normal. Mood & affect appropriate.    Data Reviewed:  CT scan and the lab results reviewed.  Family Communication: None  Disposition: Status is: Inpatient Remains inpatient appropriate because: Severity of disease, IV treatment.     Time spent: 35 minutes  Author: Marrion Coy, MD 01/20/2023 12:26 PM  For on call review [email or web address].

## 2023-01-20 NOTE — Plan of Care (Signed)
The patient is stable at this time. He has walked in the hallway independently this morning. Diet has been modified to full liquids. Reports no pain at this time. Call bell at reach. IV fluids with potassium started.  Problem: Education: Goal: Knowledge of General Education information will improve Description: Including pain rating scale, medication(s)/side effects and non-pharmacologic comfort measures Outcome: Progressing   Problem: Health Behavior/Discharge Planning: Goal: Ability to manage health-related needs will improve Outcome: Progressing   Problem: Clinical Measurements: Goal: Ability to maintain clinical measurements within normal limits will improve Outcome: Progressing Goal: Will remain free from infection Outcome: Progressing Goal: Diagnostic test results will improve Outcome: Progressing Goal: Respiratory complications will improve Outcome: Progressing Goal: Cardiovascular complication will be avoided Outcome: Progressing   Problem: Nutrition: Goal: Adequate nutrition will be maintained Outcome: Progressing   Problem: Coping: Goal: Level of anxiety will decrease Outcome: Progressing   Problem: Elimination: Goal: Will not experience complications related to bowel motility Outcome: Progressing Goal: Will not experience complications related to urinary retention Outcome: Progressing

## 2023-01-20 NOTE — Hospital Course (Signed)
Jeremiah Villarreal is a 57 y.o. male with medical history significant of essential hypertension, BPH who presents to the ER with abdominal pain.  Patient does not have leukocytosis.  However CT scan showed diverticulitis with multiple fistulous including colonic fistula and colovesical fistula. Patient does not have any evidence of acute abdomen, he was started on Zosyn.  General surgery consult obtained. Condition had improved, patient wished to go home, general surgery has cleared for discharge.  Medically stable for discharge.

## 2023-01-20 NOTE — Consult Note (Signed)
Patient ID: Jeremiah Villarreal, male   DOB: October 31, 1965, 57 y.o.   MRN: [id number]  HPI Jeremiah Villarreal is a 57 y.o. male seen in consultation at the request of Dr. Joselyn Arrow.  He presented emergency room planing of left lower quadrant pain that is mild to moderate, intermittent worsening with certain movement.  He also endorses having pneumaturia and some fecal material when he urinates.  Has been present for a couple weeks.  He denies any fevers any chills he does endorse decreased appetite. He is a heavy smoker of greater than a pack a day and also drinks daily [employer] light. Labs:Glucose is 110 albumin 3.4.  White count 14.3.  Urinalysis showed moderate hemoglobin and large leukocytes WBC more than 50.  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 Overt free air.  Evidence of colovesical fistula   HPI  Past Medical History:  Diagnosis Date   Hypertension     History reviewed. No pertinent surgical history.  History reviewed. No pertinent family history.  Social History Social History   Tobacco Use   Smoking status: Every Day    Current packs/day: 1.00    Average packs/day: 1 pack/day for 42.7 years (42.7 ttl pk-yrs)    Types: Cigarettes    Start date: 05/15/1980   Smokeless tobacco: Never  Vaping Use   Vaping status: Never Used  Substance Use Topics   Alcohol use: Yes    Alcohol/week: 6.0 standard drinks of alcohol    Types: 6 Shots of liquor per week    Comment: slowly decreasing amount of drinking   Drug use: Never    No Known Allergies  Current Facility-Administered Medications  Medication Dose Route Frequency Provider Last Rate Last Admin   amLODipine (NORVASC) tablet 5 mg  5 mg Oral Daily Mikeal Hawthorne, Mohammad L, MD   5  mg at 01/20/23 0934   dextrose 5 % and 0.45 % NaCl with KCl 20 mEq/L infusion   Intravenous Continuous Marrion Coy, MD 75 mL/hr at 01/20/23 0942 New Bag at 01/20/23 0942   enoxaparin (LOVENOX) injection 40 mg  40 mg Subcutaneous Q24H Earlie Lou L, MD   40 mg at 01/19/23 2203   irbesartan (AVAPRO) tablet 300 mg  300 mg Oral Daily Earlie Lou L, MD   300 mg at 01/20/23 0934   morphine (PF) 2 MG/ML injection 2 mg  2 mg Intravenous Q2H PRN Rometta Emery, MD       ondansetron (ZOFRAN) tablet 4 mg  4 mg Oral Q6H PRN Rometta Emery, MD       Or   ondansetron (ZOFRAN) injection 4 mg  4 mg Intravenous Q6H PRN Rometta Emery, MD       Oral care mouth rinse  15 mL Mouth Rinse PRN Marrion Coy, MD       piperacillin-tazobactam (ZOSYN) IVPB 3.375 g  3.375 g Intravenous Q8H Otelia Sergeant, RPH 12.5 mL/hr at 01/20/23 0609 Infusion Verify at 01/20/23 0609     Review of Systems Full ROS  was asked and was negative except for the information on the HPI  Physical Exam Blood pressure 135/81, pulse 62, temperature 97.6 F (36.4 C), temperature source Oral, resp. rate 18, height 6\' 1"  (1.854 m), weight 85.7 kg, SpO2 99%. CONSTITUTIONAL:  NAD. EYES: Pupils are equal, round,  Sclera are non-icteric. EARS, NOSE, MOUTH AND THROAT: The oropharynx is clear. The oral mucosa is pink and moist. Hearing is intact to voice. LYMPH NODES:  Lymph nodes in the neck are normal. RESPIRATORY:  Lungs are clear. There is normal respiratory effort, with equal breath sounds bilaterally, and without pathologic use of accessory muscles. CARDIOVASCULAR: Heart is regular without murmurs, gallops, or rubs. GI: The abdomen is  soft, tenderness to palpation in the left lower quadrant without peritonitis.  He does have a reducible umbilical hernia that is at least 3 cm.  There are no palpable masses. There is no hepatosplenomegaly. There are normal bowel sounds . GU: Rectal deferred.   MUSCULOSKELETAL: Normal muscle  strength and tone. No cyanosis or edema.   SKIN: Turgor is good and there are no pathologic skin lesions or ulcers. NEUROLOGIC: Motor and sensation is grossly normal. Cranial nerves are grossly intact. PSYCH:  Oriented to person, place and time. Affect is normal.  Data Reviewed  I have personally reviewed the patient's imaging, laboratory findings and medical records.    Assessment/Plan 57 year old male with acute on chronic diverticulitis with colovesical fistula and phlegmonous changes as well as some fistulous processes close with anterior abdominal wall.  Surprisingly he is not septic nor toxic.  Recommend continuation of IV antibiotics and serial abdominal exams.  No need for emergent surgical intervention.  I do anticipate that he will require an open sigmoid colectomy with likely a diverting loop ileostomy given all his significant comorbidities.  He does have a AAA that is right at 5 cm.  I do think that sigmoid colectomy and infection will take president over a repair of a vascular aneurysm.  I do think that we need to get some source control of potential infection before vascular will embark in a placement of a prosthetic graft in the vessels.  Moreover this will decrease further septicemia and bacteremia.  For now we will stay put and we will not rush into any surgical intervention at this time.  He will likely require close monitoring and at close semielective major colon resection.  I had an extensive discussion with the patient and he understands.  Please note that I spent 75 minutes in this encounter including personally reviewing imaging studies, coordinating his care, placing orders and performing documentation  Sterling Big, MD FACS General Surgeon 01/20/2023, 10:11 AM

## 2023-01-21 DIAGNOSIS — I745 Embolism and thrombosis of iliac artery: Secondary | ICD-10-CM

## 2023-01-21 DIAGNOSIS — I7143 Infrarenal abdominal aortic aneurysm, without rupture: Secondary | ICD-10-CM

## 2023-01-21 DIAGNOSIS — N321 Vesicointestinal fistula: Secondary | ICD-10-CM | POA: Diagnosis not present

## 2023-01-21 DIAGNOSIS — K5792 Diverticulitis of intestine, part unspecified, without perforation or abscess without bleeding: Secondary | ICD-10-CM | POA: Diagnosis not present

## 2023-01-21 DIAGNOSIS — I10 Essential (primary) hypertension: Secondary | ICD-10-CM | POA: Diagnosis not present

## 2023-01-21 LAB — COMPREHENSIVE METABOLIC PANEL
ALT: 26 U/L (ref 0–44)
AST: 26 U/L (ref 15–41)
Albumin: 2.9 g/dL — ABNORMAL LOW (ref 3.5–5.0)
Alkaline Phosphatase: 73 U/L (ref 38–126)
Anion gap: 6 (ref 5–15)
BUN: 7 mg/dL (ref 6–20)
CO2: 24 mmol/L (ref 22–32)
Calcium: 8.4 mg/dL — ABNORMAL LOW (ref 8.9–10.3)
Chloride: 107 mmol/L (ref 98–111)
Creatinine, Ser: 0.75 mg/dL (ref 0.61–1.24)
GFR, Estimated: >60 mL/min (ref >60)
Glucose, Bld: 115 mg/dL — ABNORMAL HIGH (ref 70–99)
Potassium: 3.8 mmol/L (ref 3.5–5.1)
Sodium: 137 mmol/L (ref 135–145)
Total Bilirubin: 1 mg/dL (ref 0.3–1.2)
Total Protein: 6.4 g/dL — ABNORMAL LOW (ref 6.5–8.1)

## 2023-01-21 LAB — CBC
HCT: 40.3 % (ref 39.0–52.0)
Hemoglobin: 14.1 g/dL (ref 13.0–17.0)
MCH: 35.5 pg — ABNORMAL HIGH (ref 26.0–34.0)
MCHC: 35 g/dL (ref 30.0–36.0)
MCV: 101.5 fL — ABNORMAL HIGH (ref 80.0–100.0)
Platelets: 322 10*3/uL (ref 150–400)
RBC: 3.97 MIL/uL — ABNORMAL LOW (ref 4.22–5.81)
RDW: 11.9 % (ref 11.5–15.5)
WBC: 8.6 10*3/uL (ref 4.0–10.5)
nRBC: 0 % (ref 0.0–0.2)

## 2023-01-21 LAB — MAGNESIUM: Magnesium: 2 mg/dL (ref 1.7–2.4)

## 2023-01-21 LAB — PROTIME-INR
INR: 1.1 (ref 0.8–1.2)
Prothrombin Time: 14.6 s (ref 11.4–15.2)

## 2023-01-21 MED ORDER — METRONIDAZOLE 500 MG PO TABS: 500.0000 mg | ORAL_TABLET | Freq: Three times a day (TID) | ORAL | 0 refills | Status: AC

## 2023-01-21 MED ORDER — CIPROFLOXACIN HCL 500 MG PO TABS: 500.0000 mg | ORAL_TABLET | Freq: Two times a day (BID) | ORAL | 0 refills | Status: AC

## 2023-01-21 NOTE — Progress Notes (Signed)
CC: diverticulitis Subjective: Doing well, no pain, + PO, AVSS D/w Dr Grace Isaac from IR no need for drain  Objective: Vital signs in last 24 hours: Temp:  [97.8 F (36.6 C)-98.2 F (36.8 C)] 97.8 F (36.6 C) (09/08 0353) Pulse Rate:  [68-75] 68 (09/08 0353) Resp:  [16-20] 20 (09/08 0353) BP: (133-143)/(82-91) 143/90 (09/08 0353) SpO2:  [99 %-100 %] 99 % (09/08 0353) Last BM Date : 01/20/23  Intake/Output from previous day: 09/07 0701 - 09/08 0700 In: 720 [P.O.:720] Out: -  Intake/Output this shift: Total I/O In: 240 [P.O.:240] Out: -   Physical exam: NAD alert Abd: soft , nt, no peritonitis   Lab Results: CBC  Recent Labs    01/20/23 0608 01/21/23 0551  WBC 9.5 8.6  HGB 14.2 14.1  HCT 39.8 40.3  PLT 343 322   BMET Recent Labs    01/20/23 0608 01/21/23 0551  NA 139 137  K 3.5 3.8  CL 105 107  CO2 25 24  GLUCOSE 122* 115*  BUN 9 7  CREATININE 0.64 0.75  CALCIUM 8.1* 8.4*   PT/INR Recent Labs    01/19/23 1307 01/21/23 0551  LABPROT 14.0 14.6  INR 1.1 1.1   ABG No results for input(s): "PHART", "HCO3" in the last 72 hours.  Invalid input(s): "PCO2", "PO2"  Studies/Results: CT ABDOMEN PELVIS W CONTRAST  Result Date: 01/19/2023 CLINICAL DATA:  Abdominal pain, acute, nonlocalized hematuria w/ dyuria as well as LLQ pain and Left flank pain EXAM: CT ABDOMEN AND PELVIS WITH CONTRAST TECHNIQUE: Multidetector CT imaging of the abdomen and pelvis was performed using the standard protocol following bolus administration of intravenous contrast. RADIATION DOSE REDUCTION: This exam was performed according to the departmental dose-optimization program which includes automated exposure control, adjustment of the mA and/or kV according to patient size and/or use of iterative reconstruction technique. CONTRAST:  OMNIPAQUE IOHEXOL 300 MG/ML  SOLN COMPARISON:  None Available. FINDINGS: Lower chest: Right basilar subpleural reticulations, possibly related to chronic  aspiration. Hepatobiliary: Hepatic steatosis. No focal liver lesions are visualized. There is focal fatty sparing around the gallbladder fossa. No evidence of cholelithiasis or cholecystitis. Portal veins are contrast opacify. Pancreas: No evidence of peripancreatic fat stranding to suggest pancreatitis. No evidence of pancreatic ductal dilatation. Spleen: Normal in size without focal abnormality. Adrenals/Urinary Tract: Bilateral adrenal glands are normal in appearance. Bilateral kidneys are normal in size without evidence hydronephrosis or nephrolithiasis. There is focal wall thickening anteriorly along the dome of the bladder (series 2, image 72). There is also a small locule of air along the anti dependent portion of the bladder. There is soft tissue stranding surrounding this area of wall thickening. Stomach/Bowel: There is diverticulosis with evidence of acute on chronic sigmoid diverticulitis. 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 The appendix is normal in appearance. Vascular/Lymphatic: There is an infrarenal abdominal aortic aneurysm which extends into the right common iliac artery (series 2, image 59). The infrarenal component of the aneurysm measures up to 5.1 x 4.5 cm. The component extending into the right external iliac artery measures up to 2.6 x 3.6 cm. 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 Reproductive: Prostate is unremarkable. Other: No abdominal wall hernia or abnormality. No abdominopelvic ascites.  Musculoskeletal: Bilateral pars defects at L5. IMPRESSION: 1. Acute on chronic sigmoid diverticulitis with multiple fistulous connections in the left lower quadrant, including a fistulous connection to  the anterior abdominal wall, a likely colocolic fistula, and a colovesical fistula. 2. Infrarenal abdominal aortic aneurysm which extends into the right common iliac artery. The infrarenal component of the aneurysm measures up to 5.1 x 4.5 cm. The component extending into the right external iliac artery measures up to 2.6 x 3.6 cm. Recommend vascular surgery consultation. 3. 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 4. Hepatic steatosis. Electronically Signed   By: Lorenza Cambridge M.D.   On: 01/19/2023 18:19    Anti-infectives: Anti-infectives (From admission, onward)    Start     Dose/Rate Route Frequency Ordered Stop   01/21/23 0000  metroNIDAZOLE (FLAGYL) 500 MG tablet        500 mg Oral 3 times daily 01/21/23 1203 02/03/23 2359   01/21/23 0000  ciprofloxacin (CIPRO) 500 MG tablet        500 mg Oral 2 times daily 01/21/23 1203 02/03/23 2359   01/20/23 0400  piperacillin-tazobactam (ZOSYN) IVPB 3.375 g        3.375 g 12.5 mL/hr over 240 Minutes Intravenous Every 8 hours 01/19/23 2255     01/20/23 0000  cefTRIAXone (ROCEPHIN) 2 g in sodium chloride 0.9 % 100 mL IVPB  Status:  Discontinued        2 g 200 mL/hr over 30 Minutes Intravenous Every 24 hours 01/19/23 2043 01/19/23 2240   01/20/23 0000  metroNIDAZOLE (FLAGYL) IVPB 500 mg  Status:  Discontinued        500 mg 100 mL/hr over 60 Minutes Intravenous Every 12 hours 01/19/23 2043 01/19/23 2240   01/19/23 1830  piperacillin-tazobactam (ZOSYN) IVPB 3.375 g        3.375 g 100 mL/hr over 30 Minutes Intravenous  Once 01/19/23 1815 01/19/23 1907       Assessment/Plan: 57 yo colovesical fistula responding to a/bs , doing well ,no need for emergent surgical intervention May DC home He will require sigmoid colectomy w possible ostomy  AAA EVARS likely after control of infection ( colectomy) F/U 1-2 weeks as outpt w me  Sterling Big, MD, [facility]  01/21/2023

## 2023-01-21 NOTE — Discharge Summary (Signed)
Physician Discharge Summary   Patient: Jeremiah Villarreal MRN: [id number] DOB: 04-May-1966  Admit date:     01/19/2023  Discharge date: 01/21/23  Discharge Physician: Marrion Coy   PCP: Pcp, No   Recommendations at discharge:   Follow-up with general surgery in 1 week. Follow-up with vascular surgery in 2 weeks.  Discharge Diagnoses: Principal Problem:   Acute diverticulitis Active Problems:   Essential hypertension   AAA (abdominal aortic aneurysm) (HCC)   Colovesical fistula   Aortic aneurysm (HCC)   Iliac artery occlusion, right (HCC)  Resolved Problems:   * No resolved hospital problems. *  Hospital Course: Jeremiah Villarreal is a 57 y.o. male with medical history significant of essential hypertension, BPH who presents to the ER with abdominal pain.  Patient does not have leukocytosis.  However CT scan showed diverticulitis with multiple fistulous including colonic fistula and colovesical fistula. Patient does not have any evidence of acute abdomen, he was started on Zosyn.  General surgery consult obtained. Condition had improved, patient wished to go home, general surgery has cleared for discharge.  Medically stable for discharge.  Assessment and Plan: Acute diverticulitis with colonic fistula. Patient does not have sepsis.  Patient has colonic fistula associated with acute diverticulitis, but no clinical signs of peritonitis. Patient is a followed by general surgery, continue Zosyn for now. Patient has abnormal urine study, reflecting colovesicular fistula.  Urine culture negative, blood culture also negative. Patient condition had improved today, cleared for discharge from general surgery standpoint.  Patient be continued to complete 2 weeks antibiotics with Cipro and Flagyl.  Patient be followed by general surgery in 1 week.  Essential hypertension.   Continue home medicines.   Abdominal aortic aneurysm. Iliac artery occlusion on the right. Follow-up with vascular surgery as  outpatient.         Consultants: General Surgery. Procedures performed: None  Disposition: Home Diet recommendation:  Discharge Diet Orders (From admission, onward)     Start     Ordered   01/21/23 0000  Diet general       Comments: Soft die for 3 days then regular heart healthy   01/21/23 1203           DISCHARGE MEDICATION: Allergies as of 01/21/2023   No Known Allergies      Medication List     STOP taking these medications    lidocaine 5 % Commonly known as: Lidoderm   olmesartan-hydrochlorothiazide 40-12.5 MG tablet Commonly known as: BENICAR HCT   ondansetron 4 MG disintegrating tablet Commonly known as: ZOFRAN-ODT   tamsulosin 0.4 MG Caps capsule Commonly known as: FLOMAX       TAKE these medications    amLODipine 5 MG tablet Commonly known as: NORVASC Take 1 tablet (5 mg total) by mouth daily.   ciprofloxacin 500 MG tablet Commonly known as: Cipro Take 1 tablet (500 mg total) by mouth 2 (two) times daily for 13 days.   metroNIDAZOLE 500 MG tablet Commonly known as: Flagyl Take 1 tablet (500 mg total) by mouth 3 (three) times daily for 13 days.   olmesartan 40 MG tablet Commonly known as: BENICAR Take 40 mg by mouth daily.        Follow-up Information     Pabon, [place] F, MD Follow up in 1 week(s).   Specialty: General Surgery Contact information: [address] [place] [address] [phone]         Annice Needy, MD Follow up in 2 week(s).  Specialties: Vascular Surgery, Radiology, Interventional Cardiology Contact information: [address] [place] [address] [phone]                Discharge Exam: Ceasar Mons Weights   01/19/23 1258  Weight: 85.7 kg   General exam: Appears calm and comfortable  Respiratory system: Clear to auscultation. Respiratory effort normal. Cardiovascular system: S1 & S2 heard, RRR. No JVD, murmurs, rubs, gallops or clicks. No pedal  edema. Gastrointestinal system: Abdomen is nondistended, soft and nontender. No organomegaly or masses felt. Normal bowel sounds heard. Central nervous system: Alert and oriented. No focal neurological deficits. Extremities: Symmetric 5 x 5 power. Skin: No rashes, lesions or ulcers Psychiatry: Judgement and insight appear normal. Mood & affect appropriate.    Condition at discharge: good  The results of significant diagnostics from this hospitalization (including imaging, microbiology, ancillary and laboratory) are listed below for reference.   Imaging Studies: CT ABDOMEN PELVIS W CONTRAST  Result Date: 01/19/2023 CLINICAL DATA:  Abdominal pain, acute, nonlocalized hematuria w/ dyuria as well as LLQ pain and Left flank pain EXAM: CT ABDOMEN AND PELVIS WITH CONTRAST TECHNIQUE: Multidetector CT imaging of the abdomen and pelvis was performed using the standard protocol following bolus administration of intravenous contrast. RADIATION DOSE REDUCTION: This exam was performed according to the departmental dose-optimization program which includes automated exposure control, adjustment of the mA and/or kV according to patient size and/or use of iterative reconstruction technique. CONTRAST:  OMNIPAQUE IOHEXOL 300 MG/ML  SOLN COMPARISON:  None Available. FINDINGS: Lower chest: Right basilar subpleural reticulations, possibly related to chronic aspiration. Hepatobiliary: Hepatic steatosis. No focal liver lesions are visualized. There is focal fatty sparing around the gallbladder fossa. No evidence of cholelithiasis or cholecystitis. Portal veins are contrast opacify. Pancreas: No evidence of peripancreatic fat stranding to suggest pancreatitis. No evidence of pancreatic ductal dilatation. Spleen: Normal in size without focal abnormality. Adrenals/Urinary Tract: Bilateral adrenal glands are normal in appearance. Bilateral kidneys are normal in size without evidence hydronephrosis or nephrolithiasis. There  is focal wall thickening anteriorly along the dome of the bladder (series 2, image 72). There is also a small locule of air along the anti dependent portion of the bladder. There is soft tissue stranding surrounding this area of wall thickening. Stomach/Bowel: There is diverticulosis with evidence of acute on chronic sigmoid diverticulitis. 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 The appendix is normal in appearance. Vascular/Lymphatic: There is an infrarenal abdominal aortic aneurysm which extends into the right common iliac artery (series 2, image 59). The infrarenal component of the aneurysm measures up to 5.1 x 4.5 cm. The component extending into the right external iliac artery measures up to 2.6 x 3.6 cm. 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 Reproductive: Prostate is unremarkable. Other: No abdominal wall hernia or abnormality. No abdominopelvic ascites. Musculoskeletal: Bilateral pars defects at L5. IMPRESSION: 1. Acute on chronic sigmoid diverticulitis with multiple fistulous connections in the left lower quadrant, including a fistulous connection to the anterior abdominal wall, a likely colocolic fistula, and a colovesical fistula. 2. Infrarenal abdominal aortic aneurysm which extends into the right common iliac artery. The infrarenal component of the aneurysm measures up to 5.1 x 4.5 cm. The component extending  into the right external iliac artery measures up to 2.6 x 3.6 cm. Recommend vascular surgery consultation. 3. 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 4. Hepatic steatosis. Electronically Signed   By: Lorenza Cambridge M.D.   On: 01/19/2023  18:19    Microbiology: Results for orders placed or performed during the hospital encounter of 01/19/23  Urine Culture     Status: None   Collection Time: 01/19/23  3:58 PM   Specimen: Urine, Random  Result Value Ref Range Status   Specimen Description   Final    URINE, RANDOM Performed at [facility], [address], [place], [place] [address]    Special Requests   Final    NONE Reflexed from [id number] Performed at [facility], [address], [place], [place] [address]    Culture   Final    NO GROWTH Performed at [facility], [address] [place], [place] [address]    Report Status 01/20/2023 FINAL  Final    Labs: CBC: Recent Labs  Lab 01/19/23 1307 01/20/23 0608 01/21/23 0551  WBC 14.3* 9.5 8.6  HGB 15.6 14.2 14.1  HCT 44.9 39.8 40.3  MCV 101.6* 100.0 101.5*  PLT 379 343 322   Basic Metabolic Panel: Recent Labs  Lab 01/19/23 1307 01/20/23 0608 01/21/23 0551  NA 138 139 137  K 4.0 3.5 3.8  CL 100 105 107  CO2 25 25 24   GLUCOSE 110* 122* 115*  BUN 14 9 7   CREATININE 0.87 0.64 0.75  CALCIUM 9.1 8.1* 8.4*  MG  --   --  2.0   Liver Function Tests: Recent Labs  Lab 01/19/23 1307 01/20/23 0608 01/21/23 0551  AST 21 20 26   ALT 29 24 26   ALKPHOS 96 83 73  BILITOT 1.0 0.4 1.0  PROT 7.5 6.4* 6.4*  ALBUMIN 3.4* 3.1* 2.9*   CBG: No results for input(s): "GLUCAP" in the last 168 hours.  Discharge time spent: greater than 30 minutes.  Signed: Marrion Coy, MD [facility] 01/21/2023

## 2023-01-22 NOTE — Group Note (Deleted)

## 2023-02-05 ENCOUNTER — Other Ambulatory Visit: Payer: Self-pay

## 2023-02-05 ENCOUNTER — Inpatient Hospital Stay
Admission: AD | Admit: 2023-02-05 | Discharge: 2023-02-09 | DRG: 329 | Disposition: A | Discharge status: Home / Self Care | Payer: BC Managed Care – PPO | Source: Ambulatory Visit | Attending: Surgery | Admitting: Surgery

## 2023-02-05 ENCOUNTER — Encounter (HOSPITAL_COMMUNITY): Payer: Self-pay

## 2023-02-05 ENCOUNTER — Encounter: Payer: Self-pay | Admitting: Surgery

## 2023-02-05 ENCOUNTER — Ambulatory Visit (INDEPENDENT_AMBULATORY_CARE_PROVIDER_SITE_OTHER): Payer: BC Managed Care – PPO | Admitting: Surgery

## 2023-02-05 VITALS — BP 114/71 | HR 107 | Temp 98.0°F | Ht 73.0 in | Wt 177.0 lb

## 2023-02-05 DIAGNOSIS — K436 Other and unspecified ventral hernia with obstruction, without gangrene: Secondary | ICD-10-CM | POA: Diagnosis present

## 2023-02-05 DIAGNOSIS — I10 Essential (primary) hypertension: Secondary | ICD-10-CM | POA: Diagnosis present

## 2023-02-05 DIAGNOSIS — N321 Vesicointestinal fistula: Secondary | ICD-10-CM | POA: Diagnosis present

## 2023-02-05 DIAGNOSIS — F17210 Nicotine dependence, cigarettes, uncomplicated: Secondary | ICD-10-CM | POA: Diagnosis present

## 2023-02-05 DIAGNOSIS — K5792 Diverticulitis of intestine, part unspecified, without perforation or abscess without bleeding: Principal | ICD-10-CM | POA: Diagnosis present

## 2023-02-05 DIAGNOSIS — K651 Peritoneal abscess: Secondary | ICD-10-CM | POA: Diagnosis present

## 2023-02-05 DIAGNOSIS — J449 Chronic obstructive pulmonary disease, unspecified: Secondary | ICD-10-CM | POA: Diagnosis present

## 2023-02-05 DIAGNOSIS — I739 Peripheral vascular disease, unspecified: Secondary | ICD-10-CM | POA: Diagnosis present

## 2023-02-05 DIAGNOSIS — K5720 Diverticulitis of large intestine with perforation and abscess without bleeding: Secondary | ICD-10-CM | POA: Diagnosis present

## 2023-02-05 DIAGNOSIS — E876 Hypokalemia: Secondary | ICD-10-CM | POA: Diagnosis not present

## 2023-02-05 DIAGNOSIS — Z79899 Other long term (current) drug therapy: Secondary | ICD-10-CM | POA: Diagnosis not present

## 2023-02-05 DIAGNOSIS — K632 Fistula of intestine: Secondary | ICD-10-CM | POA: Insufficient documentation

## 2023-02-05 DIAGNOSIS — K37 Unspecified appendicitis: Secondary | ICD-10-CM | POA: Diagnosis present

## 2023-02-05 DIAGNOSIS — I7143 Infrarenal abdominal aortic aneurysm, without rupture: Secondary | ICD-10-CM | POA: Diagnosis present

## 2023-02-05 DIAGNOSIS — K5732 Diverticulitis of large intestine without perforation or abscess without bleeding: Secondary | ICD-10-CM | POA: Diagnosis not present

## 2023-02-05 DIAGNOSIS — K36 Other appendicitis: Secondary | ICD-10-CM | POA: Diagnosis not present

## 2023-02-05 LAB — URINALYSIS, ROUTINE W REFLEX MICROSCOPIC
Bacteria, UA: NONE SEEN
Bilirubin Urine: NEGATIVE
Glucose, UA: NEGATIVE mg/dL
Ketones, ur: 5 mg/dL — AB
Nitrite: NEGATIVE
Protein, ur: 30 mg/dL — AB
Specific Gravity, Urine: 1.025 (ref 1.005–1.030)
Squamous Epithelial / HPF: 0 /HPF (ref 0–5)
WBC, UA: >50 WBC/hpf (ref 0–5)
pH: 5 (ref 5.0–8.0)

## 2023-02-05 LAB — SURGICAL PCR SCREEN
MRSA, PCR: NEGATIVE
Staphylococcus aureus: NEGATIVE

## 2023-02-05 MED ORDER — MELATONIN 3 MG PO TABS: 3.0000 mg | ORAL_TABLET | Freq: Every evening | ORAL | Status: DC | PRN

## 2023-02-05 MED ORDER — ONDANSETRON 4 MG PO TBDP: 4.0000 mg | ORAL_TABLET | Freq: Four times a day (QID) | ORAL | Status: DC | PRN

## 2023-02-05 MED ORDER — KETOROLAC TROMETHAMINE 15 MG/ML IJ SOLN
15.0000 mg | Freq: Four times a day (QID) | INTRAMUSCULAR | Status: DC | PRN
  Administered 2023-02-05 – 2023-02-08 (×7): 15 mg via INTRAVENOUS
  Filled 2023-02-05 (×7): qty 1

## 2023-02-05 MED ORDER — KETOROLAC TROMETHAMINE 15 MG/ML IJ SOLN: 15.0000 mg | Freq: Four times a day (QID) | INTRAMUSCULAR | Status: DC | PRN

## 2023-02-05 MED ORDER — PANTOPRAZOLE SODIUM 40 MG IV SOLR
40.0000 mg | Freq: Every day | INTRAVENOUS | Status: DC
  Administered 2023-02-05 – 2023-02-08 (×4): 40 mg via INTRAVENOUS
  Filled 2023-02-05 (×4): qty 10

## 2023-02-05 MED ORDER — NEOMYCIN SULFATE 500 MG PO TABS
1000.0000 mg | ORAL_TABLET | Freq: Once | ORAL | Status: AC
  Administered 2023-02-05: 1000 mg via ORAL
  Filled 2023-02-05: qty 2

## 2023-02-05 MED ORDER — CHLORHEXIDINE GLUCONATE CLOTH 2 % EX PADS
6.0000 | MEDICATED_PAD | Freq: Every day | CUTANEOUS | Status: DC
  Administered 2023-02-06 – 2023-02-09 (×4): 6 via TOPICAL

## 2023-02-05 MED ORDER — NEOMYCIN SULFATE 500 MG PO TABS: 1000.0000 mg | ORAL_TABLET | Freq: Once | ORAL | Status: DC

## 2023-02-05 MED ORDER — PEG 3350-KCL-NA BICARB-NACL 420 G PO SOLR: 4000.0000 mL | Freq: Once | ORAL | Status: DC

## 2023-02-05 MED ORDER — METRONIDAZOLE 250 MG PO TABS: 500.0000 mg | ORAL_TABLET | Freq: Once | ORAL | Status: DC

## 2023-02-05 MED ORDER — NICOTINE 21 MG/24HR TD PT24
21.0000 mg | MEDICATED_PATCH | Freq: Every day | TRANSDERMAL | Status: DC
  Administered 2023-02-05 – 2023-02-08 (×4): 21 mg via TRANSDERMAL
  Filled 2023-02-05 (×4): qty 1

## 2023-02-05 MED ORDER — METRONIDAZOLE 500 MG PO TABS
500.0000 mg | ORAL_TABLET | Freq: Once | ORAL | Status: AC
  Administered 2023-02-05: 500 mg via ORAL
  Filled 2023-02-05 (×2): qty 1

## 2023-02-05 MED ORDER — NICOTINE 7 MG/24HR TD PT24: 21.0000 mg | MEDICATED_PATCH | Freq: Every day | TRANSDERMAL | Status: DC

## 2023-02-05 MED ORDER — OXYCODONE HCL 5 MG PO TABS: 5.0000 mg | ORAL_TABLET | ORAL | Status: DC | PRN

## 2023-02-05 MED ORDER — ACETAMINOPHEN 325 MG PO TABS: 1000.0000 mg | ORAL_TABLET | Freq: Four times a day (QID) | ORAL | Status: DC

## 2023-02-05 MED ORDER — OXYCODONE HCL 5 MG PO TABS
5.0000 mg | ORAL_TABLET | ORAL | Status: DC | PRN
  Filled 2023-02-05: qty 1

## 2023-02-05 MED ORDER — ENOXAPARIN SODIUM 150 MG/ML IJ SOSY: 40.0000 mg | PREFILLED_SYRINGE | INTRAMUSCULAR | Status: DC

## 2023-02-05 MED ORDER — PEG 3350-KCL-NA BICARB-NACL 420 G PO SOLR
4000.0000 mL | Freq: Once | ORAL | Status: AC
  Administered 2023-02-05: 4000 mL via ORAL
  Filled 2023-02-05: qty 4000

## 2023-02-05 MED ORDER — OXYCODONE HCL 5 MG PO TABS
5.0000 mg | ORAL_TABLET | ORAL | Status: DC | PRN
Start: 2023-02-05 — End: 2023-02-05

## 2023-02-05 MED ORDER — MELATONIN 1 MG PO TABS: 3.0000 mg | ORAL_TABLET | Freq: Every evening | ORAL | Status: DC | PRN

## 2023-02-05 MED ORDER — PANTOPRAZOLE SODIUM 40 MG IV SOLR: 40.0000 mg | Freq: Every day | INTRAVENOUS | Status: DC

## 2023-02-05 MED ORDER — IRBESARTAN 150 MG PO TABS: 300.0000 mg | ORAL_TABLET | Freq: Every day | ORAL | Status: DC

## 2023-02-05 MED ORDER — PIPERACILLIN SOD-TAZOBACTAM SO 2.25 (2-0.25) G IV SOLR: 3.3750 g | Freq: Three times a day (TID) | INTRAVENOUS | Status: DC

## 2023-02-05 MED ORDER — ONDANSETRON HCL 4 MG/2ML IJ SOLN: 4.0000 mg | Freq: Four times a day (QID) | INTRAMUSCULAR | Status: DC | PRN

## 2023-02-05 MED ORDER — METRONIDAZOLE 500 MG PO TABS
500.0000 mg | ORAL_TABLET | Freq: Once | ORAL | Status: AC
  Administered 2023-02-05: 500 mg via ORAL
  Filled 2023-02-05: qty 1

## 2023-02-05 MED ORDER — SODIUM CHLORIDE 0.9 % IV SOLN: INTRAVENOUS | Status: DC

## 2023-02-05 MED ORDER — MORPHINE SULFATE (PF) 4 MG/ML IV SOLN: 2.0000 mg | INTRAVENOUS | Status: DC | PRN

## 2023-02-05 MED ORDER — ACETAMINOPHEN 500 MG PO TABS: 1000.0000 mg | ORAL_TABLET | Freq: Four times a day (QID) | ORAL | Status: DC

## 2023-02-05 MED ORDER — AMLODIPINE BESYLATE 5 MG PO TABS: 5.0000 mg | ORAL_TABLET | Freq: Every day | ORAL | Status: DC

## 2023-02-05 MED ORDER — NICOTINE 21 MG/24HR TD PT24: 21.0000 mg | MEDICATED_PATCH | Freq: Every day | TRANSDERMAL | Status: DC

## 2023-02-05 MED ORDER — BOOST / RESOURCE BREEZE PO LIQD CUSTOM
1.0000 | Freq: Three times a day (TID) | ORAL | Status: DC
  Administered 2023-02-05: 1 via ORAL

## 2023-02-05 MED ORDER — MORPHINE SULFATE (PF) 2 MG/ML IV SOLN
2.0000 mg | INTRAVENOUS | Status: DC | PRN
  Administered 2023-02-05 – 2023-02-07 (×5): 2 mg via INTRAVENOUS
  Filled 2023-02-05 (×5): qty 1

## 2023-02-05 MED ORDER — HYDROMORPHONE HCL 1 MG/ML IJ SOLN
0.5000 mg | INTRAMUSCULAR | Status: DC | PRN
  Administered 2023-02-06 – 2023-02-07 (×2): 0.5 mg via INTRAVENOUS
  Filled 2023-02-05 (×2): qty 0.5

## 2023-02-05 MED ORDER — ACETAMINOPHEN 500 MG PO TABS
1000.0000 mg | ORAL_TABLET | Freq: Four times a day (QID) | ORAL | Status: DC
  Administered 2023-02-05 – 2023-02-09 (×11): 1000 mg via ORAL
  Filled 2023-02-05 (×12): qty 2

## 2023-02-05 MED ORDER — PIPERACILLIN-TAZOBACTAM 3.375 G IVPB
3.3750 g | Freq: Three times a day (TID) | INTRAVENOUS | Status: DC
  Administered 2023-02-05 – 2023-02-09 (×11): 3.375 g via INTRAVENOUS
  Filled 2023-02-05 (×11): qty 50

## 2023-02-05 MED ORDER — HYDRALAZINE HCL 20 MG/ML IJ SOLN: 10.0000 mg | INTRAMUSCULAR | Status: DC | PRN

## 2023-02-05 MED ORDER — ENOXAPARIN SODIUM 40 MG/0.4ML IJ SOSY
40.0000 mg | PREFILLED_SYRINGE | INTRAMUSCULAR | Status: DC
  Administered 2023-02-05 – 2023-02-08 (×4): 40 mg via SUBCUTANEOUS
  Filled 2023-02-05 (×4): qty 0.4

## 2023-02-05 MED ORDER — SODIUM CHLORIDE 0.9 % IV SOLN
INTRAVENOUS | Status: DC
Start: 2023-02-05 — End: 2023-02-05

## 2023-02-05 NOTE — Patient Instructions (Signed)
Diverticulitis  Diverticulitis is when small pouches in your colon get infected or swollen. This causes pain in your belly (abdomen) and watery poop (diarrhea). The small pouches are called diverticula. They may form if you have a condition called diverticulosis. What are the causes? You may get this condition if poop (stool) gets trapped in the pouches in your colon. The poop lets germs (bacteria) grow. This causes an infection. What increases the risk? You are more likely to get this condition if you have small pouches in your colon. You are also more likely to get it if: You are overweight or very overweight (obese). You do not exercise enough. You drink alcohol. You smoke. You eat a lot of red meat, like beef, pork, or lamb. You do not eat enough fiber. You are older than 59 years of age. What are the signs or symptoms? Pain in your belly. Pain is often on the left side, but it may be felt in other spots too. Fever and chills. Feeling like you may vomit. Vomiting. Having cramps. Feeling full. Changes in how often you poop. Blood in your poop. How is this treated? Most cases are treated at home. You may be told to: Take over-the-counter pain medicines. Only eat and drink clear liquids. Take antibiotics. Rest. Very bad cases may need to be treated at a hospital. Treatment may include: Not eating or drinking. Taking pain medicines. Getting antibiotics through an IV tube. Getting fluid and food through an IV tube. Having surgery. When you are feeling better, you may need to have a test to look at your colon (colonoscopy). Follow these instructions at home: Medicines Take over-the-counter and prescription medicines only as told by your doctor. These include: Fiber pills. Probiotics. Medicines to make your poop soft (stool softeners). If you were prescribed antibiotics, take them as told by your doctor. Do not stop taking them even if you start to feel better. Ask your  doctor if you should avoid driving or using machines while you are taking your medicine. Eating and drinking  Follow the diet told by your doctor. You may need to only eat and drink liquids. When you feel better, you may be able to eat more foods. You may also be told to eat a lot of fiber. Fiber helps you poop. Foods with fiber include berries, beans, lentils, and green vegetables. Try not to eat red meat. General instructions Do not smoke or use any products that contain nicotine or tobacco. If you need help quitting, ask your doctor. Exercise 3 or more times a week. Try to go for 30 minutes each time. Exercise enough to sweat and make your heart beat faster. Contact a doctor if: Your pain gets worse. You are not pooping like normal. Your symptoms do not get better. Your symptoms get worse very fast. You have a fever. You vomit more than one time. You have poop that is: Bloody. Black. Tarry. This information is not intended to replace advice given to you by your health care provider. Make sure you discuss any questions you have with your health care provider. Document Revised: 01/26/2022 Document Reviewed: 01/26/2022 Elsevier Patient Education  2024 [employer].

## 2023-02-05 NOTE — Plan of Care (Signed)

## 2023-02-05 NOTE — Progress Notes (Signed)
Patient ID: Jeremiah Villarreal, male   DOB: [birth year], 56 y.o.   MRN: [id number]  HPI Jeremiah Villarreal is a 57 y.o. male see in in f/u after recent hospitalization for perf diverticulitis with fistula.  He continues to have left lower quadrant pain that is  moderate, intermittent worsening with certain movement.  He also endorses having pneumaturia and some fecal material when he urinates. This  Has been present for a 4-5 weeks.  He denies any fevers any chills he does endorse decreased appetite. He is a heavy smoker of greater than a pack a day and also drinks daily Coors light about 3  a day. Labs:Glucose is 110 albumin 3.4.  White count 14.3.  Urinalysis showed moderate hemoglobin and large leukocytes WBC more than 50.   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 No Overt free air.  Evidence of colovesical fistula  HPI  Past Medical History:  Diagnosis Date   Hypertension     History reviewed. No pertinent surgical history.  History reviewed. No pertinent family history.  Social History Social History   Tobacco Use   Smoking status: Every Day    Current packs/day: 1.00    Average packs/day: 1 pack/day for 42.7 years (42.7 ttl pk-yrs)    Types: Cigarettes    Start date: 05/15/1980   Smokeless tobacco: Never  Vaping Use   Vaping status: Never Used  Substance Use Topics   Alcohol use: Yes    Alcohol/week: 6.0 standard drinks of alcohol    Types: 6 Shots of liquor per week    Comment: slowly decreasing amount of drinking   Drug use: Never    No Known Allergies  Current Outpatient Medications  Medication Sig Dispense Refill   amLODipine (NORVASC) 5 MG tablet Take 1 tablet (5 mg total) by mouth daily. 30 tablet 2    olmesartan (BENICAR) 40 MG tablet Take 40 mg by mouth daily.     No current facility-administered medications for this visit.     Review of Systems Full ROS  was asked and was negative except for the information on the HPI  Physical Exam Blood pressure 114/71, pulse (!) 107, temperature 98 F (36.7 C), temperature source Oral, height 6\' 1"  (1.854 m), weight 177 lb (80.3 kg). CONSTITUTIONAL: NAD. EYES: Pupils are equal, round,  Sclera are non-icteric. EARS, NOSE, MOUTH AND THROAT: T. The oral mucosa is pink and moist. Hearing is intact to voice. LYMPH NODES:  Lymph nodes in the neck are normal. RESPIRATORY:  Lungs are clear. There is normal respiratory effort, with equal breath sounds bilaterally, and without pathologic use of accessory muscles. CARDIOVASCULAR: Heart is regular without murmurs, gallops, or rubs. GI: The abdomen is  soft,tender to palpation Left lower quadrant with focal peritonitis.  There are no palpable masses. There is no hepatosplenomegaly. There are normal bowel sounds iGU: Rectal deferred.   MUSCULOSKELETAL: Normal muscle strength and tone. No cyanosis or edema.   SKIN: Turgor is good and there are no pathologic skin lesions or ulcers. NEUROLOGIC: Motor and sensation is grossly normal. Cranial nerves are grossly intact. PSYCH:  Oriented to person, place and time. Affect is normal.  Data Reviewed  I have personally reviewed the patient's imaging, laboratory findings and medical records.    Assessment/Plan  57 year old male with acute on chronic diverticulitis with colovesical fistula and phlegmonous changes as well as some fistulous processes close with anterior abdominal wall.   He cotinuest to have significant pain and has some localized peritonitis. I recommended direct admission to the hospital with initiaion of antibiotics IV and promp hartmann's tomorrow.   I do anticipate that he will require an open sigmoid colectomy and colostomy given all his significant  comorbidities.  He does have a AAA that is right at 5 cm.  I do think that sigmoid colectomy and infection will take president over a repair of a vascular aneurysm.  I do think that we need to get some source control of potential infection before vascular will embark in a placement of a prosthetic graft in the vessels.  Moreover this will decrease further septicemia and bacteremia.   I had an extensive discussion with the patient and he understands.  Please note that I spent 45 minutes in this encounter including personally reviewing imaging studies, coordinating his care, placing orders and performing documentation  I placed direct admission order and plan on hartmann's tomorrow. I do not think he will require emergent surgical intervention today but he definitely requires hospitalization. We will try to attempt to do a bowel prep to keep options open.  Sterling Big, MD FACS General Surgeon 02/05/2023, 10:04 AM

## 2023-02-05 NOTE — H&P (Signed)
HPI Jeremiah Villarreal is a 57 y.o. male see in in f/u after recent hospitalization for perf diverticulitis with fistula.  He continues to have left lower quadrant pain that is  moderate, intermittent worsening with certain movement.  He also endorses having pneumaturia and some fecal material when he urinates. This  Has been present for a 4-5 weeks.  He denies any fevers any chills he does endorse decreased appetite. He is a heavy smoker of greater than a pack a day and also drinks daily Coors light about 3  a day. Labs:Glucose is 110 albumin 3.4.  White count 14.3.  Urinalysis showed moderate hemoglobin and large leukocytes WBC more than 50.   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 No Overt free air.  Evidence of colovesical fistula   HPI       Past Medical History:  Diagnosis Date   Hypertension            History reviewed. No pertinent surgical history.       History reviewed. No pertinent family history.       Social History Social History  Social History         Tobacco Use   Smoking status: Every Day      Current packs/day: 1.00      Average packs/day: 1 pack/day for 42.7 years (42.7 ttl pk-yrs)      Types: Cigarettes      Start date: 05/15/1980   Smokeless tobacco: Never  Vaping Use   Vaping status: Never Used  Substance Use Topics   Alcohol use: Yes      Alcohol/week: 6.0 standard drinks of alcohol      Types: 6 Shots of liquor per week      Comment: slowly decreasing amount of drinking   Drug use: Never        Allergies  No Known Allergies           Current Outpatient Medications  Medication Sig Dispense Refill   amLODipine (NORVASC) 5 MG tablet Take 1 tablet (5 mg total) by mouth  daily. 30 tablet 2   olmesartan (BENICAR) 40 MG tablet Take 40 mg by mouth daily.          No current facility-administered medications for this visit.          Review of Systems Full ROS  was asked and was negative except for the information on the HPI   Physical Exam Blood pressure 114/71, pulse (!) 107, temperature 98 F (36.7 C), temperature source Oral, height 6\' 1"  (1.854 m), weight 177 lb (80.3 kg). CONSTITUTIONAL: NAD. EYES: Pupils are equal, round,  Sclera are non-icteric. EARS, NOSE, MOUTH AND THROAT: T. The oral mucosa is pink and moist. Hearing is intact to voice. LYMPH NODES:  Lymph nodes in the neck are normal. RESPIRATORY:  Lungs are clear. There is normal respiratory effort, with equal breath sounds bilaterally, and without pathologic use of accessory muscles. CARDIOVASCULAR: Heart is regular without murmurs, gallops, or rubs. GI: The abdomen is  soft,tender to palpation Left lower quadrant with focal peritonitis.  There are no palpable masses. There is no hepatosplenomegaly. There are normal bowel sounds iGU: Rectal deferred.   MUSCULOSKELETAL: Normal muscle strength and tone. No cyanosis or edema.  SKIN: Turgor is good and there are no pathologic skin lesions or ulcers. NEUROLOGIC: Motor and sensation is grossly normal. Cranial nerves are grossly intact. PSYCH:  Oriented to person, place and time. Affect is normal.   Data Reviewed   I have personally reviewed the patient's imaging, laboratory findings and medical records.     Assessment/Plan 57 year old male with acute on chronic diverticulitis with colovesical fistula and phlegmonous changes as well as some fistulous processes close with anterior abdominal wall.   He cotinuest to have significant pain and has some localized peritonitis. I recommended direct admission to the hospital with initiaion of antibiotics IV and promp hartmann's tomorrow.   I do anticipate that he will require an open sigmoid colectomy and  colostomy given all his significant comorbidities.  He does have a AAA that is right at 5 cm.  I do think that sigmoid colectomy and infection will take president over a repair of a vascular aneurysm.  I do think that we need to get some source control of potential infection before vascular will embark in a placement of a prosthetic graft in the vessels.  Moreover this will decrease further septicemia and bacteremia.   I had an extensive discussion with the patient and he understands.  Please note that I spent 45 minutes in this encounter including personally reviewing imaging studies, coordinating his care, placing orders and performing documentation  I placed orders to include bowel prep, antibiotics and plan on hartmann's tomorrow. I do not think he will require emergent surgical intervention today but he definitely requires hospitalization . We will try to attempt to do a bowel prep to keep options open.

## 2023-02-06 ENCOUNTER — Other Ambulatory Visit: Payer: Self-pay

## 2023-02-06 ENCOUNTER — Inpatient Hospital Stay: Payer: Self-pay

## 2023-02-06 ENCOUNTER — Encounter: Payer: Self-pay | Admitting: Surgery

## 2023-02-06 ENCOUNTER — Encounter
Admission: AD | Disposition: A | Discharge status: Home / Self Care | Payer: Self-pay | Source: Ambulatory Visit | Attending: Surgery

## 2023-02-06 DIAGNOSIS — K632 Fistula of intestine: Secondary | ICD-10-CM | POA: Diagnosis not present

## 2023-02-06 DIAGNOSIS — K5720 Diverticulitis of large intestine with perforation and abscess without bleeding: Secondary | ICD-10-CM | POA: Diagnosis not present

## 2023-02-06 DIAGNOSIS — K36 Other appendicitis: Secondary | ICD-10-CM | POA: Diagnosis not present

## 2023-02-06 HISTORY — PX: BLADDER REPAIR: SHX6721

## 2023-02-06 HISTORY — PX: APPENDECTOMY: SHX54

## 2023-02-06 HISTORY — PX: COLECTOMY WITH COLOSTOMY CREATION/HARTMANN PROCEDURE: SHX6598

## 2023-02-06 HISTORY — PX: VENTRAL HERNIA REPAIR: SHX424

## 2023-02-06 LAB — COMPREHENSIVE METABOLIC PANEL
ALT: 19 U/L (ref 0–44)
AST: 23 U/L (ref 15–41)
Albumin: 3 g/dL — ABNORMAL LOW (ref 3.5–5.0)
Alkaline Phosphatase: 58 U/L (ref 38–126)
Anion gap: 8 (ref 5–15)
BUN: 14 mg/dL (ref 6–20)
CO2: 25 mmol/L (ref 22–32)
Calcium: 8.2 mg/dL — ABNORMAL LOW (ref 8.9–10.3)
Chloride: 105 mmol/L (ref 98–111)
Creatinine, Ser: 0.77 mg/dL (ref 0.61–1.24)
GFR, Estimated: >60 mL/min (ref >60)
Glucose, Bld: 109 mg/dL — ABNORMAL HIGH (ref 70–99)
Potassium: 3.7 mmol/L (ref 3.5–5.1)
Sodium: 138 mmol/L (ref 135–145)
Total Bilirubin: 0.9 mg/dL (ref 0.3–1.2)
Total Protein: 6.1 g/dL — ABNORMAL LOW (ref 6.5–8.1)

## 2023-02-06 LAB — CBC
HCT: 41.9 % (ref 39.0–52.0)
Hemoglobin: 14.1 g/dL (ref 13.0–17.0)
MCH: 34.3 pg — ABNORMAL HIGH (ref 26.0–34.0)
MCHC: 33.7 g/dL (ref 30.0–36.0)
MCV: 101.9 fL — ABNORMAL HIGH (ref 80.0–100.0)
Platelets: 332 10*3/uL (ref 150–400)
RBC: 4.11 MIL/uL — ABNORMAL LOW (ref 4.22–5.81)
RDW: 11.8 % (ref 11.5–15.5)
WBC: 11.4 10*3/uL — ABNORMAL HIGH (ref 4.0–10.5)
nRBC: 0 % (ref 0.0–0.2)

## 2023-02-06 LAB — MAGNESIUM: Magnesium: 1.8 mg/dL (ref 1.7–2.4)

## 2023-02-06 LAB — PHOSPHORUS: Phosphorus: 3.1 mg/dL (ref 2.5–4.6)

## 2023-02-06 SURGERY — COLECTOMY, WITH COLOSTOMY CREATION
Anesthesia: Choice

## 2023-02-06 SURGERY — COLECTOMY, WITH COLOSTOMY CREATION
Anesthesia: General | Site: Pelvis

## 2023-02-06 MED ORDER — OXYCODONE HCL 5 MG PO TABS: 5.0000 mg | ORAL_TABLET | Freq: Once | ORAL | Status: DC | PRN

## 2023-02-06 MED ORDER — PHENYLEPHRINE HCL (PRESSORS) 10 MG/ML IV SOLN
INTRAVENOUS | Status: DC | PRN
  Administered 2023-02-06 (×3): 80 ug via INTRAVENOUS
  Administered 2023-02-06: 40 ug via INTRAVENOUS

## 2023-02-06 MED ORDER — FENTANYL CITRATE (PF) 100 MCG/2ML IJ SOLN
INTRAMUSCULAR | Status: AC
  Filled 2023-02-06: qty 2

## 2023-02-06 MED ORDER — SUCCINYLCHOLINE CHLORIDE 200 MG/10ML IV SOSY
PREFILLED_SYRINGE | INTRAVENOUS | Status: DC | PRN
  Administered 2023-02-06: 100 mg via INTRAVENOUS

## 2023-02-06 MED ORDER — METHYLENE BLUE (ANTIDOTE) 1 % IV SOLN
INTRAVENOUS | Status: DC | PRN
Start: 2023-02-06 — End: 2023-02-06
  Administered 2023-02-06: 50 mL

## 2023-02-06 MED ORDER — AMLODIPINE BESYLATE 5 MG PO TABS
5.0000 mg | ORAL_TABLET | Freq: Every day | ORAL | Status: DC
  Administered 2023-02-07 – 2023-02-09 (×3): 5 mg via ORAL
  Filled 2023-02-06 (×3): qty 1

## 2023-02-06 MED ORDER — ALBUMIN HUMAN 5 % IV SOLN: INTRAVENOUS | Status: DC | PRN

## 2023-02-06 MED ORDER — PROPOFOL 10 MG/ML IV BOLUS
INTRAVENOUS | Status: AC
  Filled 2023-02-06: qty 40

## 2023-02-06 MED ORDER — MIDAZOLAM HCL 2 MG/2ML IJ SOLN
INTRAMUSCULAR | Status: DC | PRN
  Administered 2023-02-06: 2 mg via INTRAVENOUS

## 2023-02-06 MED ORDER — PROPOFOL 10 MG/ML IV BOLUS
INTRAVENOUS | Status: DC | PRN
  Administered 2023-02-06: 150 mg via INTRAVENOUS
  Administered 2023-02-06: 50 mg via INTRAVENOUS

## 2023-02-06 MED ORDER — FENTANYL CITRATE (PF) 100 MCG/2ML IJ SOLN
25.0000 ug | INTRAMUSCULAR | Status: DC | PRN
  Administered 2023-02-06 (×3): 25 ug via INTRAVENOUS

## 2023-02-06 MED ORDER — ONDANSETRON HCL 4 MG/2ML IJ SOLN
INTRAMUSCULAR | Status: DC | PRN
  Administered 2023-02-06: 4 mg via INTRAVENOUS

## 2023-02-06 MED ORDER — KETAMINE HCL 50 MG/5ML IJ SOSY
PREFILLED_SYRINGE | INTRAMUSCULAR | Status: AC
  Filled 2023-02-06: qty 5

## 2023-02-06 MED ORDER — ENSURE ENLIVE PO LIQD
237.0000 mL | Freq: Three times a day (TID) | ORAL | Status: DC
  Administered 2023-02-06 – 2023-02-09 (×7): 237 mL via ORAL

## 2023-02-06 MED ORDER — SUCCINYLCHOLINE CHLORIDE 200 MG/10ML IV SOSY
PREFILLED_SYRINGE | INTRAVENOUS | Status: AC
  Filled 2023-02-06: qty 10

## 2023-02-06 MED ORDER — METHYLERGONOVINE MALEATE 0.2 MG/ML IJ SOLN
INTRAMUSCULAR | Status: AC
  Filled 2023-02-06: qty 1

## 2023-02-06 MED ORDER — ADULT MULTIVITAMIN W/MINERALS CH
1.0000 | ORAL_TABLET | Freq: Every day | ORAL | Status: DC
  Administered 2023-02-07 – 2023-02-09 (×3): 1 via ORAL
  Filled 2023-02-06 (×3): qty 1

## 2023-02-06 MED ORDER — PHENYLEPHRINE HCL-NACL 20-0.9 MG/250ML-% IV SOLN
INTRAVENOUS | Status: DC | PRN
  Administered 2023-02-06: 10 ug/min via INTRAVENOUS

## 2023-02-06 MED ORDER — SEPRAFILM FOR OPTIME
ORAL_FILM | TOPICAL | Status: DC | PRN
Start: 2023-02-06 — End: 2023-02-06
  Administered 2023-02-06: 4 via TOPICAL

## 2023-02-06 MED ORDER — BUPIVACAINE-EPINEPHRINE (PF) 0.25% -1:200000 IJ SOLN
INTRAMUSCULAR | Status: AC
  Filled 2023-02-06: qty 30

## 2023-02-06 MED ORDER — KETAMINE HCL 10 MG/ML IJ SOLN
INTRAMUSCULAR | Status: DC | PRN
Start: 2023-02-06 — End: 2023-02-06
  Administered 2023-02-06: 10 mg via INTRAVENOUS
  Administered 2023-02-06: 40 mg via INTRAVENOUS

## 2023-02-06 MED ORDER — SODIUM CHLORIDE (PF) 0.9 % IJ SOLN
INTRAMUSCULAR | Status: DC | PRN
  Administered 2023-02-06: 100 mL via SURGICAL_CAVITY

## 2023-02-06 MED ORDER — SODIUM CHLORIDE (PF) 0.9 % IJ SOLN
INTRAMUSCULAR | Status: AC
  Filled 2023-02-06: qty 50

## 2023-02-06 MED ORDER — HYDROMORPHONE HCL 1 MG/ML IJ SOLN
INTRAMUSCULAR | Status: AC
  Filled 2023-02-06: qty 1

## 2023-02-06 MED ORDER — ALBUTEROL SULFATE HFA 108 (90 BASE) MCG/ACT IN AERS
INHALATION_SPRAY | RESPIRATORY_TRACT | Status: AC
  Filled 2023-02-06: qty 6.7

## 2023-02-06 MED ORDER — ALBUTEROL SULFATE HFA 108 (90 BASE) MCG/ACT IN AERS
INHALATION_SPRAY | RESPIRATORY_TRACT | Status: DC | PRN
  Administered 2023-02-06: 8 via RESPIRATORY_TRACT

## 2023-02-06 MED ORDER — LIDOCAINE HCL (CARDIAC) PF 100 MG/5ML IV SOSY
PREFILLED_SYRINGE | INTRAVENOUS | Status: DC | PRN
  Administered 2023-02-06: 80 mg via INTRAVENOUS

## 2023-02-06 MED ORDER — ROCURONIUM BROMIDE 100 MG/10ML IV SOLN
INTRAVENOUS | Status: DC | PRN
  Administered 2023-02-06: 10 mg via INTRAVENOUS
  Administered 2023-02-06: 20 mg via INTRAVENOUS
  Administered 2023-02-06: 50 mg via INTRAVENOUS
  Administered 2023-02-06: 30 mg via INTRAVENOUS

## 2023-02-06 MED ORDER — ONDANSETRON HCL 4 MG/2ML IJ SOLN
INTRAMUSCULAR | Status: AC
  Filled 2023-02-06: qty 2

## 2023-02-06 MED ORDER — PIPERACILLIN-TAZOBACTAM 3.375 G IVPB
INTRAVENOUS | Status: AC
  Filled 2023-02-06: qty 50

## 2023-02-06 MED ORDER — FENTANYL CITRATE (PF) 100 MCG/2ML IJ SOLN
INTRAMUSCULAR | Status: DC | PRN
  Administered 2023-02-06 (×2): 50 ug via INTRAVENOUS

## 2023-02-06 MED ORDER — ALBUMIN HUMAN 5 % IV SOLN
INTRAVENOUS | Status: AC
  Filled 2023-02-06: qty 500

## 2023-02-06 MED ORDER — DROPERIDOL 2.5 MG/ML IJ SOLN: 0.6250 mg | Freq: Once | INTRAMUSCULAR | Status: DC | PRN

## 2023-02-06 MED ORDER — LIDOCAINE HCL (PF) 2 % IJ SOLN
INTRAMUSCULAR | Status: AC
  Filled 2023-02-06: qty 5

## 2023-02-06 MED ORDER — METHYLENE BLUE (ANTIDOTE) 1 % IV SOLN
INTRAVENOUS | Status: AC
  Filled 2023-02-06: qty 10

## 2023-02-06 MED ORDER — STERILE WATER FOR IRRIGATION IR SOLN
Status: DC | PRN
  Administered 2023-02-06: 800 mL

## 2023-02-06 MED ORDER — PHENYLEPHRINE HCL-NACL 20-0.9 MG/250ML-% IV SOLN
INTRAVENOUS | Status: AC
  Filled 2023-02-06: qty 250

## 2023-02-06 MED ORDER — ACETAMINOPHEN 10 MG/ML IV SOLN: 1000.0000 mg | Freq: Once | INTRAVENOUS | Status: DC | PRN

## 2023-02-06 MED ORDER — OXYCODONE HCL 5 MG/5ML PO SOLN: 5.0000 mg | Freq: Once | ORAL | Status: DC | PRN

## 2023-02-06 MED ORDER — BUPIVACAINE LIPOSOME 1.3 % IJ SUSP
INTRAMUSCULAR | Status: AC
  Filled 2023-02-06: qty 20

## 2023-02-06 MED ORDER — SUGAMMADEX SODIUM 200 MG/2ML IV SOLN
INTRAVENOUS | Status: DC | PRN
  Administered 2023-02-06: 200 mg via INTRAVENOUS

## 2023-02-06 MED ORDER — ROCURONIUM BROMIDE 10 MG/ML (PF) SYRINGE
PREFILLED_SYRINGE | INTRAVENOUS | Status: AC
  Filled 2023-02-06: qty 10

## 2023-02-06 MED ORDER — HYDROMORPHONE HCL 1 MG/ML IJ SOLN
INTRAMUSCULAR | Status: DC | PRN
  Administered 2023-02-06: .5 mg via INTRAVENOUS

## 2023-02-06 MED ORDER — MIDAZOLAM HCL 2 MG/2ML IJ SOLN
INTRAMUSCULAR | Status: AC
  Filled 2023-02-06: qty 2

## 2023-02-06 MED ORDER — DEXAMETHASONE SODIUM PHOSPHATE 10 MG/ML IJ SOLN
INTRAMUSCULAR | Status: DC | PRN
  Administered 2023-02-06: 5 mg via INTRAVENOUS

## 2023-02-06 MED ORDER — PROMETHAZINE HCL 25 MG/ML IJ SOLN: 6.2500 mg | INTRAMUSCULAR | Status: DC | PRN

## 2023-02-06 MED ORDER — IRRISEPT - 450ML BOTTLE WITH 0.05% CHG IN STERILE WATER, USP 99.95% OPTIME
TOPICAL | Status: DC | PRN
  Administered 2023-02-06: 450 mL

## 2023-02-06 MED ORDER — SEVOFLURANE IN SOLN
RESPIRATORY_TRACT | Status: AC
  Filled 2023-02-06: qty 250

## 2023-02-06 SURGICAL SUPPLY — 68 items
ADH LQ OCL WTPRF AMP STRL LF (MISCELLANEOUS) ×3
ADHESIVE MASTISOL STRL (MISCELLANEOUS) IMPLANT
APL PRP STRL LF DISP 70% ISPRP (MISCELLANEOUS) ×3
BAG DECANTER FOR FLEXI CONT (MISCELLANEOUS) IMPLANT
BARRIER ADH SEPRAFILM 3INX5IN (MISCELLANEOUS) IMPLANT
BRR ADH 5X3 SEPRAFILM 2 SHT (MISCELLANEOUS) ×6
CHLORAPREP W/TINT 26 (MISCELLANEOUS) ×3 IMPLANT
DRAIN CHANNEL JP 19F (MISCELLANEOUS) IMPLANT
DRAIN PENROSE 12X.25 LTX STRL (MISCELLANEOUS) IMPLANT
DRAPE LAPAROTOMY 100X77 ABD (DRAPES) ×3 IMPLANT
DRAPE WARM FLUID 44X44 (DRAPES) IMPLANT
DRSG OPSITE POSTOP 4X12 (GAUZE/BANDAGES/DRESSINGS) IMPLANT
DRSG TEGADERM 4X4.75 (GAUZE/BANDAGES/DRESSINGS) IMPLANT
ELECT BLADE 6.5 EXT (BLADE) IMPLANT
ELECT CAUTERY BLADE 6.4 (BLADE) IMPLANT
ELECT EZSTD 165MM 6.5IN (MISCELLANEOUS) ×3
ELECT REM PT RETURN 9FT ADLT (ELECTROSURGICAL) ×3
ELECTRODE EZSTD 165MM 6.5IN (MISCELLANEOUS) ×3 IMPLANT
ELECTRODE REM PT RTRN 9FT ADLT (ELECTROSURGICAL) ×3 IMPLANT
GAUZE 4X4 16PLY ~~LOC~~+RFID DBL (SPONGE) ×3 IMPLANT
GAUZE SPONGE 4X4 12PLY STRL (GAUZE/BANDAGES/DRESSINGS) ×3 IMPLANT
GLOVE BIO SURGEON STRL SZ7 (GLOVE) ×6 IMPLANT
GOWN STRL REUS W/ TWL LRG LVL3 (GOWN DISPOSABLE) ×12 IMPLANT
GOWN STRL REUS W/TWL LRG LVL3 (GOWN DISPOSABLE) ×18
IV NS 100ML SINGLE PACK (IV SOLUTION) IMPLANT
JET LAVAGE IRRISEPT WOUND (IRRIGATION / IRRIGATOR) ×3
KIT OSTOMY 2 PC DRNBL 2.25 STR (WOUND CARE) IMPLANT
KIT OSTOMY DRAINABLE 2.25 STR (WOUND CARE) ×3
KIT TURNOVER KIT A (KITS) ×3 IMPLANT
LABEL OR SOLS (LABEL) ×3 IMPLANT
LAVAGE JET IRRISEPT WOUND (IRRIGATION / IRRIGATOR) IMPLANT
LIGASURE IMPACT 36 18CM CVD LR (INSTRUMENTS) ×3 IMPLANT
MANIFOLD NEPTUNE II (INSTRUMENTS) ×3 IMPLANT
NDL HYPO 22X1.5 SAFETY MO (MISCELLANEOUS) ×3 IMPLANT
NEEDLE HYPO 22X1.5 SAFETY MO (MISCELLANEOUS) ×3 IMPLANT
NS IRRIG 1000ML POUR BTL (IV SOLUTION) ×3 IMPLANT
PACK BASIN MAJOR ARMC (MISCELLANEOUS) ×3 IMPLANT
PACK COLON CLEAN CLOSURE (MISCELLANEOUS) ×3 IMPLANT
RELOAD BL CONTOUR (ENDOMECHANICALS) ×3 IMPLANT
RELOAD PROXIMATE 30MM BLUE (ENDOMECHANICALS) IMPLANT
RELOAD PROXIMATE 75MM BLUE (ENDOMECHANICALS) ×6 IMPLANT
RELOAD STAPLE 30 3.6 BLU REG (ENDOMECHANICALS) IMPLANT
RELOAD STAPLE 40 BLU REG (ENDOMECHANICALS) IMPLANT
RELOAD STAPLE 75 3.8 BLU REG (ENDOMECHANICALS) IMPLANT
SPIKE FLUID TRANSFER (MISCELLANEOUS) IMPLANT
SPONGE DRAIN TRACH 4X4 STRL 2S (GAUZE/BANDAGES/DRESSINGS) IMPLANT
SPONGE T-LAP 18X18 ~~LOC~~+RFID (SPONGE) ×15 IMPLANT
SPONGE T-LAP 18X36 ~~LOC~~+RFID STR (SPONGE) IMPLANT
STAPLER CVD CUT BL 40 RELOAD (ENDOMECHANICALS) ×3 IMPLANT
STAPLER CVD CUT BLU 40 RELOAD (ENDOMECHANICALS) ×3 IMPLANT
STAPLER PROXIMATE 75MM BLUE (STAPLE) IMPLANT
STAPLER SKIN PROX 35W (STAPLE) ×3 IMPLANT
SUT ETHILON 3-0 FS-10 30 BLK (SUTURE) ×3
SUT PDS AB 0 CT1 27 (SUTURE) ×9 IMPLANT
SUT PROLENE 2 0 SH DA (SUTURE) IMPLANT
SUT SILK 2 0 (SUTURE) ×3
SUT SILK 2 0SH CR/8 30 (SUTURE) ×3 IMPLANT
SUT SILK 2-0 18XBRD TIE 12 (SUTURE) ×3 IMPLANT
SUT VIC AB 0 CT2 27 (SUTURE) IMPLANT
SUT VIC AB 3-0 SH 27 (SUTURE) ×12
SUT VIC AB 3-0 SH 27X BRD (SUTURE) ×3 IMPLANT
SUTURE EHLN 3-0 FS-10 30 BLK (SUTURE) IMPLANT
SYR 20ML LL LF (SYRINGE) ×3 IMPLANT
SYR 30ML LL (SYRINGE) IMPLANT
TRAP FLUID SMOKE EVACUATOR (MISCELLANEOUS) ×3 IMPLANT
TRAY FOLEY MTR SLVR 16FR STAT (SET/KITS/TRAYS/PACK) ×3 IMPLANT
WATER STERILE IRR 1000ML POUR (IV SOLUTION) IMPLANT
WATER STERILE IRR 500ML POUR (IV SOLUTION) ×3 IMPLANT

## 2023-02-06 NOTE — Transfer of Care (Signed)
Immediate Anesthesia Transfer of Care Note  Patient: Jeremiah Villarreal  Procedure(s) Performed: COLECTOMY WITH COLOSTOMY CREATION/HARTMANN PROCEDURE, WITH TAKEDOWN OF SPLENIC FLEXTURE (Abdomen) APPENDECTOMY BLADDER REPAIR (Pelvis) HERNIA REPAIR VENTRAL ADULT (Abdomen)  Patient Location: PACU  Anesthesia Type:General  Level of Consciousness: awake and responds to stimulation  Airway & Oxygen Therapy: Patient Spontanous Breathing and Patient connected to face mask oxygen  Post-op Assessment: Report given to RN and Post -op Vital signs reviewed and stable  Post vital signs: Reviewed and stable  Last Vitals:  Vitals Value Taken Time  BP 106/71 02/06/23 1119  Temp    Pulse 80 02/06/23 1125  Resp 15 02/06/23 1125  SpO2 100 % 02/06/23 1125  Vitals shown include unfiled device data.  Last Pain:  Vitals:   02/06/23 0707  TempSrc: Oral  PainSc: 8       Patients Stated Pain Goal: 0 (02/05/23 1900)  Complications: No notable events documented.

## 2023-02-06 NOTE — Progress Notes (Signed)
Initial Nutrition Assessment  DOCUMENTATION CODES:   Not applicable  INTERVENTION:   Ensure Enlive po TID, each supplement provides 350 kcal and 20 grams of protein.  MVI po daily   Pt at high refeed risk; recommend monitor potassium, magnesium and phosphorus labs daily until stable  Daily weights   NUTRITION DIAGNOSIS:   Increased nutrient needs related to post-op healing as evidenced by estimated needs.  GOAL:   Patient will meet greater than or equal to 90% of their needs  MONITOR:   PO intake, Supplement acceptance, Diet advancement, Labs, Weight trends, I & O's, Skin  REASON FOR ASSESSMENT:   Malnutrition Screening Tool    ASSESSMENT:   57 y/o male with h/o HTN, AAA, moderate etoh use and diverticulitis who is admitted with severe chronic diverticulitis with phlegmon formation, pelvic abscess, colovesical fistula and incarcerated ventral hernia s/p Hartmann's procedure, repair of 3 cm ventral hernia, repair of bladder fistula and appendectomy 9/24.  Met with pt in room today; pt reports that he is feeling pretty rough. Pt reports abdominal pain and distension today. Pt reports that he feels like he needs to have a BM. Pt reports intermittent abdominal pain that has been going on for the past two months. Pt reports weight loss pta. Per chart, pt has lost 23lbs(12%) over the past two months; this is significant weight loss. Pt reports poor oral intake for the past week. Pt's lunch tray is sitting on his side table untouched. RD discussed with pt the importance of adequate nutrition needed to preserve lean muscle and to support post op healing. RD will add supplements and MVI to help pt meet his estimated needs (prefers chocolate). Pt is at high refeed risk.    Medications reviewed and include: lovenox, nicotine, protonix, NaCl @100ml /hr, zosyn  Labs reviewed: K 3.7 wnl, P 3.1 wnl, Mg 1.8 wnl Wbc- 11.4(H)  NUTRITION - FOCUSED PHYSICAL EXAM:  Flowsheet Row Most Recent  Value  Orbital Region No depletion  Upper Arm Region Mild depletion  Thoracic and Lumbar Region No depletion  Buccal Region No depletion  Temple Region No depletion  Clavicle Bone Region Mild depletion  Clavicle and Acromion Bone Region Mild depletion  Scapular Bone Region No depletion  Dorsal Hand No depletion  Patellar Region No depletion  Anterior Thigh Region No depletion  Posterior Calf Region No depletion  Edema (RD Assessment) None  Hair Reviewed  Eyes Reviewed  Mouth Reviewed  Skin Reviewed  Nails Reviewed   Diet Order:   Diet Order             Diet full liquid Fluid consistency: Thin  Diet effective now                  EDUCATION NEEDS:   Education needs have been addressed  Skin:  Skin Assessment: Reviewed RN Assessment (incision abdomen)  Last BM:  9/23  Height:   Ht Readings from Last 1 Encounters:  02/05/23 6\' 1"  (1.854 m)    Weight:   Wt Readings from Last 1 Encounters:  02/05/23 80.3 kg    Ideal Body Weight:  83.6 kg  BMI:  There is no height or weight on file to calculate BMI.  Estimated Nutritional Needs:   Kcal:  2200-2500kcal/day  Protein:  110-125g/day  Fluid:  2.5-2.8L/day  Betsey Holiday MS, RD, LDN Please refer to [facility] for RD and/or RD on-call/weekend/after hours pager

## 2023-02-06 NOTE — Op Note (Signed)
PROCEDURES: 1. Hartmann's procedure 2. Takedown of splenic flexure 3. Repair of 3 cm ventral hernia incarcerated 4. Repair of bladder fistula  5. Appendectomy   Pre-operative Diagnosis:colovesical  fistula. Severe Chronic diverticulitis with abscess  Post-operative Diagnosis: same   Surgeon: Leafy Ro MD    Anesthesia: General endotracheal anesthesia   ASA Class: 3   Surgeon: Sterling Big , MD FACS   Anesthesia: Gen. with endotracheal tube   Findings: Severe Chronic diverticulitis with Phlegmon formation indicating active infection Pelvic abscess  ( with gross purulence) tracking to the Left inguinal region and abominal wall Colovesical fistula 3 cm ventral hernia incarcerated   Estimated Blood Loss: 250cc         Drains: 19 FR         Specimens: sigmoid colon, appendix       Complications: none               Condition: stable   Procedure Details  The patient was seen again in the Holding Room. The benefits, complications, treatment options, and expected outcomes were discussed with the patient. The risks of bleeding, infection, recurrence of symptoms, failure to resolve symptoms,  bowel injury, any of which could require further surgery were reviewed with the patient.   The patient was taken to Operating Room, identified and the procedure verified.  A Time Out was held and the above information confirmed.   Prior to the induction of general anesthesia, antibiotic prophylaxis was administered. VTE prophylaxis was in place. General endotracheal anesthesia was then administered and tolerated well. After the induction, the abdomen was prepped with Chloraprep and draped in the sterile fashion. The patient was positioned in lithotomy position. Generous midline laparotomy incision created with 10 blade knife. We identified a 3 cm chronically incarcerated ventral hertnia, the sac was excised and the fascial edges cleaned. The abdominal cavity was entered under direct  visualization. There were dense adhesions from the omentum to the abdominal wall that were lysed in the standard fashion with electrocautery and scissors. . There was significant adhesive disease in the pelvis from the sigmoid to the pelvic wall and also from the sigmoid to the urinary bladder. These adhesions were lysed with a combination of finger fracturing and electrocauteryl.  The white line of TOLDT was identified and divided and we mobilized the descending colon IN a lateral to medial fashion. We preserved the ureter at all times. We were also able to mobilize the splenic flexure using electrocautery. Attention was turned to the pelvis, chronic diverticulitis with Phlegmonous changes in the sigmoid were found. We also drained a pelvic abscess that tracked to the left inguinal canal, cultures obtained from the pus that was drained.There was a fistula between the bladder sigmoid.   Ureter was identified and protected at all times. 0 prolen was used to [facility] the end of the rectal stump. We identified the takeoff of the inferior mesenteric artery dissected the pedicle and divided using a vessel sealer. Using the sealer we were able to divide the mesorectum and and also divided proximal to the mesentery of the descending colon. Once we have an adequate visualization and mobilization we divided the sigmoid colon distally at the junction of the rectosigmoid area with Contour stapler. Proximal area of transection was a healthy segment of descending colon.This was divided with GIA stapler. ANother look of the pelvis reveled a chronically inflamed appendix that was retrocecal. I decided that the best thing to do was to resect it. The mesoappendix was divided  with ligasure and the appendix divided with GIA stapler.  There was a small hole 5 mm in the dome of the bladder that was repaired  in a two layer fashion with 0 vicryl. Mr. Manus Rudd Placed methylene blue via the foley and we check for any leak. No evidence of  leaks observed on the bladder repair. It was a water tight repair.  Colostomy was brought up via left rectus below the umbilicus after I created an incision on left abdominal wall. There was also adequate hemostasis. A 19 Blake drain was placed in the pelvis. We were able to mobilize the omentum and IThe drain was sutured in place with a 3-0 nylon. A second look showed no evidence of any bleeding or any other injuries.   Seprafilm was placed to prevent adhesions We changed gloves and  close the   abdomen with a -0 PDS suture in a running fashion using small bite technique, the prior ventral defect was incorporated within the closure. Liposomal Marcaine  was injected on all incision sites under direct visualization. Given Phlegmonous changes and active infection I placed a penrose and loosely closed the incision with staples. Sterile dressing placed.  Colostomy matured in the standard brook fashion w multiple 3-0 vicryls.   Needle and laparotomy count were correct and there were no immediate occasions   Sterling Big, MD, FACS

## 2023-02-06 NOTE — Anesthesia Procedure Notes (Signed)
Procedure Name: Intubation Date/Time: 02/06/2023 7:43 AM  Performed by: Carolynne Edouard, RNPre-anesthesia Checklist: Patient identified, Patient being monitored, Timeout performed, Emergency Drugs available and Suction available Patient Re-evaluated:Patient Re-evaluated prior to induction Oxygen Delivery Method: Circle system utilized Preoxygenation: Pre-oxygenation with 100% oxygen Induction Type: IV induction Ventilation: Mask ventilation with difficulty Laryngoscope Size: McGraph and 4 Grade View: Grade I Tube type: Oral Tube size: 7.5 mm Number of attempts: 1 Airway Equipment and Method: Stylet Placement Confirmation: ETT inserted through vocal cords under direct vision, positive ETCO2 and breath sounds checked- equal and bilateral Secured at: 23 cm Tube secured with: Tape Dental Injury: Dental damage  Comments: On initial attempt to scissor mouth open, upper incisor tooth chipped due to pressure from finger. SRNA immediately stopped attempt at largyngoscopy and retrieved chipped tooth. Chipped tooth placed in sterile container. Laryngscopy followed by CRNA without trauma and ETT placed.

## 2023-02-06 NOTE — Anesthesia Postprocedure Evaluation (Signed)
Anesthesia Post Note  Patient: Jeremiah Villarreal  Procedure(s) Performed: COLECTOMY WITH COLOSTOMY CREATION/HARTMANN PROCEDURE, WITH TAKEDOWN OF SPLENIC FLEXTURE (Abdomen) APPENDECTOMY BLADDER REPAIR (Pelvis) HERNIA REPAIR VENTRAL ADULT (Abdomen)  Patient location during evaluation: PACU Anesthesia Type: General Level of consciousness: awake and alert Pain management: pain level controlled Vital Signs Assessment: post-procedure vital signs reviewed and stable Respiratory status: spontaneous breathing, nonlabored ventilation, respiratory function stable and patient connected to nasal cannula oxygen Cardiovascular status: blood pressure returned to baseline and stable Postop Assessment: no apparent nausea or vomiting Anesthetic complications: no   No notable events documented.   Last Vitals:  Vitals:   02/06/23 1145 02/06/23 1200  BP: 116/85 121/83  Pulse: 73 75  Resp: 12 13  Temp:    SpO2: 95% 91%    Last Pain:  Vitals:   02/06/23 1145  TempSrc:   PainSc: 6                  Yevette Edwards

## 2023-02-06 NOTE — Progress Notes (Signed)
Pt seen and examined. Took about 1/2 of the prep , could not ulcerated due to nausea.  He does have persistent abdominal pain.  I do not think we should delay his Hartman's procedure due to incomplete prep.  I do think that this is as good as were going to get. Gust with patient in detail about the surgery.  Risk, benefits and possible indications.  He fully understands the situation he is got significant high risk for perioperative morbidity and mortality given his active EtOH use, smoker, peripheral vascular disease, active infection, hypertension and COPD.

## 2023-02-06 NOTE — Anesthesia Preprocedure Evaluation (Addendum)
Anesthesia Evaluation  Patient identified by MRN, date of birth, ID band Patient awake    Reviewed: Allergy & Precautions, H&P , NPO status , Patient's Chart, lab work & pertinent test results, reviewed documented beta blocker date and time   Airway Mallampati: II  TM Distance: >3 FB Neck ROM: full    Dental  (+) Poor Dentition, Chipped   Pulmonary neg pulmonary ROS, Current Smoker   Pulmonary exam normal        Cardiovascular Exercise Tolerance: Good hypertension, On Medications + Peripheral Vascular Disease  negative cardio ROS Normal cardiovascular exam Rhythm:regular Rate:Normal     Neuro/Psych negative neurological ROS  negative psych ROS   GI/Hepatic negative GI ROS, Neg liver ROS,,,  Endo/Other  negative endocrine ROS    Renal/GU negative Renal ROS  negative genitourinary   Musculoskeletal   Abdominal   Peds  Hematology negative hematology ROS (+)   Anesthesia Other Findings Past Medical History: No date: Hypertension History reviewed. No pertinent surgical history.   Reproductive/Obstetrics negative OB ROS                             Anesthesia Physical Anesthesia Plan  ASA: 2  Anesthesia Plan: General ETT   Post-op Pain Management:    Induction:   PONV Risk Score and Plan: 2  Airway Management Planned:   Additional Equipment:   Intra-op Plan:   Post-operative Plan:   Informed Consent: I have reviewed the patients History and Physical, chart, labs and discussed the procedure including the risks, benefits and alternatives for the proposed anesthesia with the patient or authorized representative who has indicated his/her understanding and acceptance.     Dental Advisory Given  Plan Discussed with: CRNA  Anesthesia Plan Comments:        Anesthesia Quick Evaluation

## 2023-02-07 ENCOUNTER — Encounter: Payer: Self-pay | Admitting: Surgery

## 2023-02-07 LAB — COMPREHENSIVE METABOLIC PANEL
ALT: 18 U/L (ref 0–44)
AST: 21 U/L (ref 15–41)
Albumin: 2.9 g/dL — ABNORMAL LOW (ref 3.5–5.0)
Alkaline Phosphatase: 44 U/L (ref 38–126)
Anion gap: 6 (ref 5–15)
BUN: 8 mg/dL (ref 6–20)
CO2: 21 mmol/L — ABNORMAL LOW (ref 22–32)
Calcium: 7.6 mg/dL — ABNORMAL LOW (ref 8.9–10.3)
Chloride: 111 mmol/L (ref 98–111)
Creatinine, Ser: 0.6 mg/dL — ABNORMAL LOW (ref 0.61–1.24)
GFR, Estimated: >60 mL/min (ref >60)
Glucose, Bld: 108 mg/dL — ABNORMAL HIGH (ref 70–99)
Potassium: 3.6 mmol/L (ref 3.5–5.1)
Sodium: 138 mmol/L (ref 135–145)
Total Bilirubin: 1 mg/dL (ref 0.3–1.2)
Total Protein: 5.7 g/dL — ABNORMAL LOW (ref 6.5–8.1)

## 2023-02-07 LAB — CBC
HCT: 39.6 % (ref 39.0–52.0)
Hemoglobin: 13.4 g/dL (ref 13.0–17.0)
MCH: 34.8 pg — ABNORMAL HIGH (ref 26.0–34.0)
MCHC: 33.8 g/dL (ref 30.0–36.0)
MCV: 102.9 fL — ABNORMAL HIGH (ref 80.0–100.0)
Platelets: 312 10*3/uL (ref 150–400)
RBC: 3.85 MIL/uL — ABNORMAL LOW (ref 4.22–5.81)
RDW: 11.9 % (ref 11.5–15.5)
WBC: 10.9 10*3/uL — ABNORMAL HIGH (ref 4.0–10.5)
nRBC: 0 % (ref 0.0–0.2)

## 2023-02-07 LAB — MAGNESIUM: Magnesium: 1.8 mg/dL (ref 1.7–2.4)

## 2023-02-07 NOTE — Progress Notes (Signed)
[facility] SURGICAL PROGRESS NOTE  Hospital Day(s): 2.   Post op day(s): 1 Day Post-Op.   Interval History:  Patient seen and examined No acute events or new complaints overnight.  Patient reports he is feeling much better Abdominal soreness No fever, chills, nausea, emesis  Leukocytosis is improving; now 10.9K (from 11.4K) Hgb to 13.4 Renal function normal; sCr - 0.60; UO - 755 ccs No electrolyte derangements Surgical drain with 110 ccs; serosanguinous FLD; has been cautious with eating Colostomy without gas/stool yet  Vital signs in last 24 hours: [min-max] current  Temp:  [97.3 F (36.3 C)-98.6 F (37 C)] 98.1 F (36.7 C) (09/25 0347) Pulse Rate:  [71-83] 72 (09/25 0347) Resp:  [12-20] 20 (09/25 0347) BP: (111-137)/(65-89) 137/89 (09/25 0347) SpO2:  [91 %-97 %] 96 % (09/25 0347) Weight:  [89.5 kg-91 kg] 91 kg (09/25 0500)       Weight: 91 kg BMI (Calculated): 26.47   Intake/Output last 2 shifts:  09/24 0701 - 09/25 0700 In: 3050 [I.V.:2500; IV Piggyback:550] Out: 1115 [Urine:755; Drains:110; Blood:250]   Physical Exam:  Constitutional: alert, cooperative and no distress  Respiratory: breathing non-labored at rest  Cardiovascular: regular rate and sinus rhythm  Gastrointestinal: soft, incisional soreness, non-distended, no rebound/guarding. Surgical drain in RLQ; output serosanguinous. Colostomy in left mid-abdomen; dusky, bowel sweat in bag, no gas/stool Genitourinary: Foley in place  Integumentary: Laparotomy is intact with staples and penrose, scant areas of serosanguinous drainage on honeycomb  Labs:     Latest Ref Rng & Units 02/07/2023    3:42 AM 02/06/2023    4:29 AM 01/21/2023    5:51 AM  CBC  WBC 4.0 - 10.5 K/uL 10.9  11.4  8.6   Hemoglobin 13.0 - 17.0 g/dL 40.1  02.7  25.3   Hematocrit 39.0 - 52.0 % 39.6  41.9  40.3   Platelets 150 - 400 K/uL 312  332  322       Latest Ref Rng & Units 02/07/2023    3:42 AM 02/06/2023    4:29 AM  01/21/2023    5:51 AM  CMP  Glucose 70 - 99 mg/dL 664  403  474   BUN 6 - 20 mg/dL 8  14  7    Creatinine 0.61 - 1.24 mg/dL 2.59  5.63  8.75   Sodium 135 - 145 mmol/L 138  138  137   Potassium 3.5 - 5.1 mmol/L 3.6  3.7  3.8   Chloride 98 - 111 mmol/L 111  105  107   CO2 22 - 32 mmol/L 21  25  24    Calcium 8.9 - 10.3 mg/dL 7.6  8.2  8.4   Total Protein 6.5 - 8.1 g/dL 5.7  6.1  6.4   Total Bilirubin 0.3 - 1.2 mg/dL 1.0  0.9  1.0   Alkaline Phos 38 - 126 U/L 44  58  73   AST 15 - 41 U/L 21  23  26    ALT 0 - 44 U/L 18  19  26       Imaging studies: No new pertinent imaging studies   Assessment/Plan:  57 y.o. male 1 Day Post-Op s/p exploratory laparotomy, colectomy with end colostomy creation, repair of colovesical fistula, and appendectomy   - Will continue FLD for now; He is cautious about eating. I do think when ready we can advance to soft diet.   - Continue IV Abx  - Continue foley catheter; He will need to keep this for 10-14  days and follow up outpatient with urology for cystogram given repair of colovesical fistula.   - Continue surgical drain; monitor and record output - Monitor abdominal examination - Engage WOC RN for ostomy teaching    - Pain control prn; antiemetics prn - Monitor leukocytosis; improving - Mobilize     - Discharge Planning: Doing well, awaiting return of bowel function. Hopefully home in 48 hours.    All of the above findings and recommendations were discussed with the patient, and the medical team, and all of patient's questions were answered to his expressed satisfaction.  -- [name], PA-C [facility] 02/07/2023, 7:56 AM M-F: 7am - 4pm

## 2023-02-07 NOTE — Consult Note (Signed)
WOC consult received for new end colostomy placed 02/06/2023 by Dr. Everlene Farrier. WOC team will plan first postop visit Thursday 02/08/2023.    Thank you,    Priscella Mann MSN, RN-BC, [employer] [phone]

## 2023-02-07 NOTE — TOC Initial Note (Signed)
Transition of Care [facility]) - Initial/Assessment Note    Patient Details  Name: Jeremiah Villarreal MRN: [id number] Date of Birth: 1966-04-03  Transition of Care [facility]) CM/SW Contact:    Darolyn Rua, LCSW Phone Number: 02/07/2023, 9:14 AM  Clinical Narrative:                  Patient from home with left lower quadrant pain. Patient is 1 day post op of new colostomy, drain, and appendectomy. Continues with IV abx, foley for 10-14 days, WOC RN consulted for ostomy teaching to be completed tomorrow Thursday 9/26.   TOC will follow for discharge planning needs as anticipate HH needs at time of discharge.   Expected Discharge Plan: Home w Home Health Services Barriers to Discharge: Continued Medical Work up   Patient Goals and CMS Choice Patient states their goals for this hospitalization and ongoing recovery are:: to go home CMS Medicare.gov Compare Post Acute Care list provided to:: Patient Choice offered to / list presented to : Patient      Expected Discharge Plan and Services                                              Prior Living Arrangements/Services                       Activities of Daily Living Home Assistive Devices/Equipment: None ADL Screening (condition at time of admission) Does the patient have a NEW difficulty with bathing/dressing/toileting/self-feeding that is expected to last >3 days?: No Does the patient have a NEW difficulty with getting in/out of bed, walking, or climbing stairs that is expected to last >3 days?: No Does the patient have a NEW difficulty with communication that is expected to last >3 days?: No Is the patient deaf or have difficulty hearing?: No Does the patient have difficulty seeing, even when wearing glasses/contacts?: No Does the patient have difficulty concentrating, remembering, or making decisions?: No  Permission Sought/Granted                  Emotional Assessment              Admission diagnosis:   Acute diverticulitis [K57.92] Patient Active Problem List   Diagnosis Date Noted   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 02/05/2023   Aortic aneurysm (HCC) 01/20/2023   Iliac artery occlusion, 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   Acute diverticulitis 01/19/2023   Essential hypertension 01/19/2023   AAA (abdominal aortic aneurysm) (HCC) 01/19/2023   Colovesical fistula 01/19/2023   PCP:  Cyndia Diver, PA-C Pharmacy:   [facility] [phone] [place] Sheen, [place] - [address] [place] [address] Phone: [phone] Fax: [phone]     Social Determinants of Health (SDOH) Social History: SDOH Screenings   Food Insecurity: No Food Insecurity (02/05/2023)  Housing: Low Risk  (02/05/2023)  Transportation Needs: No Transportation Needs (02/05/2023)  Utilities: Not At Risk (02/05/2023)  Financial Resource Strain: Patient Declined (11/29/2022)   Received from [facility]  Tobacco Use: High Risk (02/06/2023)   SDOH Interventions:     Readmission Risk Interventions     No data to display

## 2023-02-07 NOTE — Plan of Care (Signed)

## 2023-02-07 NOTE — Plan of Care (Signed)
Problem: Education: Goal: Knowledge of General Education information will improve Description Including pain rating scale, medication(s)/side effects and non-pharmacologic comfort measures Outcome: Progressing   Problem: Clinical Measurements: Goal: Ability to maintain clinical measurements within normal limits will improve Outcome: Progressing Goal: Will remain free from infection Outcome: Progressing Goal: Cardiovascular complication will be avoided Outcome: Progressing   Problem: Activity: Goal: Risk for activity intolerance will decrease Outcome: Progressing   Problem: Pain Managment: Goal: General experience of comfort will improve Outcome: Progressing

## 2023-02-07 NOTE — Plan of Care (Signed)
  Problem: Education: Goal: Knowledge of General Education information will improve Description: Including pain rating scale, medication(s)/side effects and non-pharmacologic comfort measures 02/07/2023 1959 by Murriel Hopper, Donald Pore, RN Outcome: Progressing 02/07/2023 1958 by Murriel Hopper, Donald Pore, RN Outcome: Progressing   Problem: Health Behavior/Discharge Planning: Goal: Ability to manage health-related needs will improve 02/07/2023 1959 by Monica Becton, RN Outcome: Progressing 02/07/2023 1958 by Murriel Hopper, Donald Pore, RN Outcome: Progressing   Problem: Clinical Measurements: Goal: Ability to maintain clinical measurements within normal limits will improve 02/07/2023 1959 by Monica Becton, RN Outcome: Progressing 02/07/2023 1958 by Murriel Hopper, Donald Pore, RN Outcome: Progressing Goal: Will remain free from infection 02/07/2023 1959 by Monica Becton, RN Outcome: Progressing 02/07/2023 1958 by Monica Becton, RN Outcome: Progressing Goal: Diagnostic test results will improve 02/07/2023 1959 by Monica Becton, RN Outcome: Progressing 02/07/2023 1958 by Murriel Hopper, Donald Pore, RN Outcome: Progressing Goal: Respiratory complications will improve 02/07/2023 1959 by Monica Becton, RN Outcome: Progressing 02/07/2023 1958 by Monica Becton, RN Outcome: Progressing Goal: Cardiovascular complication will be avoided 02/07/2023 1959 by Monica Becton, RN Outcome: Progressing 02/07/2023 1958 by Monica Becton, RN Outcome: Progressing   Problem: Activity: Goal: Risk for activity intolerance will decrease 02/07/2023 1959 by Murriel Hopper, Donald Pore, RN Outcome: Progressing 02/07/2023 1958 by Murriel Hopper, Donald Pore, RN Outcome: Progressing   Problem: Nutrition: Goal: Adequate nutrition will be maintained 02/07/2023 1959 by Murriel Hopper, Donald Pore, RN Outcome: Progressing 02/07/2023 1958 by Murriel Hopper, Donald Pore, RN Outcome: Progressing   Problem: Coping: Goal: Level of anxiety will decrease 02/07/2023 1959 by Murriel Hopper, Donald Pore, RN Outcome: Progressing 02/07/2023 1958 by Murriel Hopper, Donald Pore, RN Outcome: Progressing   Problem: Elimination: Goal: Will not experience complications related to bowel motility 02/07/2023 1959 by Monica Becton, RN Outcome: Progressing 02/07/2023 1958 by Murriel Hopper, Donald Pore, RN Outcome: Progressing Goal: Will not experience complications related to urinary retention 02/07/2023 1959 by Monica Becton, RN Outcome: Progressing 02/07/2023 1958 by Murriel Hopper, Donald Pore, RN Outcome: Progressing   Problem: Pain Managment: Goal: General experience of comfort will improve 02/07/2023 1959 by Monica Becton, RN Outcome: Progressing 02/07/2023 1958 by Murriel Hopper, Donald Pore, RN Outcome: Progressing   Problem: Safety: Goal: Ability to remain free from injury will improve 02/07/2023 1959 by Monica Becton, RN Outcome: Progressing 02/07/2023 1958 by Murriel Hopper, Donald Pore, RN Outcome: Progressing   Problem: Skin Integrity: Goal: Risk for impaired skin integrity will decrease 02/07/2023 1959 by Monica Becton, RN Outcome: Progressing 02/07/2023 1958 by Monica Becton, RN Outcome: Progressing

## 2023-02-08 ENCOUNTER — Other Ambulatory Visit: Payer: Self-pay | Admitting: Urology

## 2023-02-08 LAB — BASIC METABOLIC PANEL
Anion gap: 5 (ref 5–15)
BUN: 9 mg/dL (ref 6–20)
CO2: 23 mmol/L (ref 22–32)
Calcium: 7.8 mg/dL — ABNORMAL LOW (ref 8.9–10.3)
Chloride: 112 mmol/L — ABNORMAL HIGH (ref 98–111)
Creatinine, Ser: 0.61 mg/dL (ref 0.61–1.24)
GFR, Estimated: >60 mL/min (ref >60)
Glucose, Bld: 88 mg/dL (ref 70–99)
Potassium: 3.4 mmol/L — ABNORMAL LOW (ref 3.5–5.1)
Sodium: 140 mmol/L (ref 135–145)

## 2023-02-08 LAB — CBC
HCT: 37.4 % — ABNORMAL LOW (ref 39.0–52.0)
Hemoglobin: 12.6 g/dL — ABNORMAL LOW (ref 13.0–17.0)
MCH: 34.4 pg — ABNORMAL HIGH (ref 26.0–34.0)
MCHC: 33.7 g/dL (ref 30.0–36.0)
MCV: 102.2 fL — ABNORMAL HIGH (ref 80.0–100.0)
Platelets: 326 10*3/uL (ref 150–400)
RBC: 3.66 MIL/uL — ABNORMAL LOW (ref 4.22–5.81)
RDW: 12 % (ref 11.5–15.5)
WBC: 10 10*3/uL (ref 4.0–10.5)
nRBC: 0 % (ref 0.0–0.2)

## 2023-02-08 LAB — AEROBIC/ANAEROBIC CULTURE W GRAM STAIN (SURGICAL/DEEP WOUND)

## 2023-02-08 LAB — SURGICAL PATHOLOGY

## 2023-02-08 MED ORDER — POTASSIUM CHLORIDE CRYS ER 20 MEQ PO TBCR
20.0000 meq | EXTENDED_RELEASE_TABLET | Freq: Two times a day (BID) | ORAL | Status: DC
  Administered 2023-02-08 – 2023-02-09 (×3): 20 meq via ORAL
  Filled 2023-02-08 (×3): qty 1

## 2023-02-08 NOTE — Discharge Instructions (Signed)
In addition to included general post-operative instructions,  Diet: Resume home diet.   Activity: No heavy lifting >20 pounds (children, pets, laundry, garbage) or strenuous activity for 6 weeks, but light activity and walking are encouraged. Do not drive or drink alcohol if taking narcotic pain medications or having pain that might distract from driving.  Wound care: You may shower/get incision wet with soapy water and pat dry (do not rub incisions), but no baths or submerging incision underwater until follow-up. We will remove staples in follow up   Medications: Resume all home medications. For mild to moderate pain: acetaminophen (Tylenol) or ibuprofen/naproxen (if no kidney disease). Combining Tylenol with alcohol can substantially increase your risk of causing liver disease. Narcotic pain medications, if prescribed, can be used for severe pain, though may cause nausea, constipation, and drowsiness. Do not combine Tylenol and Percocet (or similar) within a 6 hour period as Percocet (and similar) contain(s) Tylenol. If you do not need the narcotic pain medication, you do not need to fill the prescription.  Call office [phone] / [phone]) at any time if any questions, worsening pain, fevers/chills, bleeding, drainage from incision site, or other concerns.

## 2023-02-08 NOTE — Consult Note (Signed)
WOC Nurse ostomy consult note Stoma type/location: LMQ flush colostomy Stomal assessment/size: oval, 2 cm x 2.5 cm flush and red. NO stool production at this time Peristomal assessment: midline staple line with penrose drain at proximal and distal end.  Honeycomb is removed.  Area cleansed and dry dressing/tape placed over the wound.  Treatment options for stomal/peristomal skin:  barrier ring and 1 piece convex pouch  Output no stool. Stoma sweat only Ostomy pouching: 1pc. Convex with barrier ring Education provided: pouch change performed.  We measure stoma and have to cut slightly larger than 1" due to oval shape.  Pattern left at beside.   I observe patient cut barrier to fit.  He observes me place barrier ring and he applies pouch.  He rolls closed and we discuss emptying when 1/3 full. Twice weekly pouch changes.  HE can shower with or without the pouch on.  I leave written materials in room along with flyer for the outpatient ostomy clinic at [facility].  Enrolled patient in [facility] DC program: Yes will today.  Will follow.  Mike Gip MSN, RN, FNP-BC CWON Wound, Ostomy, Continence Nurse Outpatient [facility] [phone] Pager [phone]

## 2023-02-08 NOTE — Plan of Care (Signed)

## 2023-02-08 NOTE — Progress Notes (Signed)
[facility] SURGICAL PROGRESS NOTE  Hospital Day(s): 3.   Post op day(s): 2 Days Post-Op.   Interval History:  Patient seen and examined No acute events or new complaints overnight.  Patient reports he is doing very well this morning He denied any significant abdominal pain No fever, chills, nausea, emesis  Leukocytosis resolved; now 10.0K Hgb to 12.6 Renal function normal; sCr - 0.61; UO - 1525 ccs Mild hypokalemia to 3.4 Surgical drain with 40 ccs; serosanguinous FLD; tolerating well Colostomy with gas/no stool   Vital signs in last 24 hours: [min-max] current  Temp:  [97.9 F (36.6 C)-98.2 F (36.8 C)] 98.1 F (36.7 C) (09/26 0229) Pulse Rate:  [75-95] 80 (09/26 0229) Resp:  [17-18] 17 (09/26 0229) BP: (124-145)/(63-94) 133/82 (09/26 0229) SpO2:  [95 %-97 %] 95 % (09/26 0229) Weight:  [91.2 kg] 91.2 kg (09/26 0413)       Weight: 91.2 kg BMI (Calculated): 26.53   Intake/Output last 2 shifts:  09/25 0701 - 09/26 0700 In: 1385.3 [P.O.:240; I.V.:1145.3] Out: 1565 [Urine:1525; Drains:40]   Physical Exam:  Constitutional: alert, cooperative and no distress  Respiratory: breathing non-labored at rest  Cardiovascular: regular rate and sinus rhythm  Gastrointestinal: soft, incisional soreness, non-distended, no rebound/guarding. Surgical drain in RLQ; output serosanguinous. Colostomy in left mid-abdomen; dusky, bowel sweat in bag, minimal gas Genitourinary: Foley in place  Integumentary: Laparotomy is intact with staples and penrose, scant areas of serosanguinous drainage on honeycomb  Labs:     Latest Ref Rng & Units 02/08/2023    6:04 AM 02/07/2023    3:42 AM 02/06/2023    4:29 AM  CBC  WBC 4.0 - 10.5 K/uL 10.0  10.9  11.4   Hemoglobin 13.0 - 17.0 g/dL 40.1  02.7  25.3   Hematocrit 39.0 - 52.0 % 37.4  39.6  41.9   Platelets 150 - 400 K/uL 326  312  332       Latest Ref Rng & Units 02/08/2023    6:04 AM 02/07/2023    3:42 AM 02/06/2023    4:29 AM   CMP  Glucose 70 - 99 mg/dL 88  664  403   BUN 6 - 20 mg/dL 9  8  14    Creatinine 0.61 - 1.24 mg/dL 4.74  2.59  5.63   Sodium 135 - 145 mmol/L 140  138  138   Potassium 3.5 - 5.1 mmol/L 3.4  3.6  3.7   Chloride 98 - 111 mmol/L 112  111  105   CO2 22 - 32 mmol/L 23  21  25    Calcium 8.9 - 10.3 mg/dL 7.8  7.6  8.2   Total Protein 6.5 - 8.1 g/dL  5.7  6.1   Total Bilirubin 0.3 - 1.2 mg/dL  1.0  0.9   Alkaline Phos 38 - 126 U/L  44  58   AST 15 - 41 U/L  21  23   ALT 0 - 44 U/L  18  19      Imaging studies: No new pertinent imaging studies   Assessment/Plan:  57 y.o. male 2 Days Post-Op s/p exploratory laparotomy, colectomy with end colostomy creation, repair of colovesical fistula, and appendectomy   - Soft diet today   - Continue IV Abx (Zosyn); will need PO at DC - was previously on Cipro/Flagyl prior to admission, will likely switch to Augmentin at DC  - Continue foley catheter; He will need to keep this for 10-14 days and follow  up outpatient with urology for cystogram given repair of colovesical fistula.   - Continue surgical drain; monitor and record output - we can remove this prior to DC - Monitor abdominal examination - Engage WOC RN for ostomy teaching    - Pain control prn; antiemetics prn - Monitor leukocytosis; resolved - Mobilize     - Discharge Planning: Doing well, ROBF, diet advancing. He is still awaiting ostomy teaching which is planned for today. I do think he will be ready for DC in the next 24 hours, likely tomorrow (09/27). He is in agreement.   All of the above findings and recommendations were discussed with the patient, and the medical team, and all of patient's questions were answered to his expressed satisfaction.  -- [name], PA-C [facility] 02/08/2023, 7:10 AM M-F: 7am - 4pm

## 2023-02-09 LAB — BASIC METABOLIC PANEL
Anion gap: 8 (ref 5–15)
BUN: 8 mg/dL (ref 6–20)
CO2: 25 mmol/L (ref 22–32)
Calcium: 8.3 mg/dL — ABNORMAL LOW (ref 8.9–10.3)
Chloride: 106 mmol/L (ref 98–111)
Creatinine, Ser: 0.51 mg/dL — ABNORMAL LOW (ref 0.61–1.24)
GFR, Estimated: >60 mL/min (ref >60)
Glucose, Bld: 91 mg/dL (ref 70–99)
Potassium: 3.5 mmol/L (ref 3.5–5.1)
Sodium: 139 mmol/L (ref 135–145)

## 2023-02-09 LAB — CBC
HCT: 36.5 % — ABNORMAL LOW (ref 39.0–52.0)
Hemoglobin: 12.8 g/dL — ABNORMAL LOW (ref 13.0–17.0)
MCH: 34.9 pg — ABNORMAL HIGH (ref 26.0–34.0)
MCHC: 35.1 g/dL (ref 30.0–36.0)
MCV: 99.5 fL (ref 80.0–100.0)
Platelets: 340 10*3/uL (ref 150–400)
RBC: 3.67 MIL/uL — ABNORMAL LOW (ref 4.22–5.81)
RDW: 11.7 % (ref 11.5–15.5)
WBC: 9.6 10*3/uL (ref 4.0–10.5)
nRBC: 0 % (ref 0.0–0.2)

## 2023-02-09 MED ORDER — ONDANSETRON 4 MG PO TBDP: 4.0000 mg | ORAL_TABLET | Freq: Four times a day (QID) | ORAL | 0 refills | Status: DC | PRN

## 2023-02-09 MED ORDER — AMOXICILLIN-POT CLAVULANATE 875-125 MG PO TABS: 1.0000 | ORAL_TABLET | Freq: Two times a day (BID) | ORAL | 0 refills | Status: AC

## 2023-02-09 MED ORDER — OXYCODONE HCL 5 MG PO TABS: 5.0000 mg | ORAL_TABLET | Freq: Four times a day (QID) | ORAL | 0 refills | Status: DC | PRN

## 2023-02-09 NOTE — Discharge Summary (Signed)
[facility] SURGICAL DISCHARGE SUMMARY  Patient ID: Jeremiah Villarreal MRN: [id number] DOB/AGE: 07/06/1965 57 y.o.  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 Hartmann's procedure 2. Takedown of splenic flexure 3. Repair of 3 cm ventral hernia incarcerated 4. Repair of bladder fistula  5. Appendectomy   HPI: Jeremiah Villarreal is a 57 y.o. male see in in f/u after recent hospitalization for perf diverticulitis with fistula.  He continues to have left lower quadrant pain that is  moderate, intermittent worsening with certain movement.  He also endorses having pneumaturia and some fecal material when he urinates. This  Has been present for a 4-5 weeks.  He denies any fevers any chills he does endorse decreased appetite. He is a heavy smoker of greater than a pack a day and also drinks daily Coors light about 3  a day. Labs:Glucose is 110 albumin 3.4.  White count 14.3.  Urinalysis showed moderate hemoglobin and large leukocytes WBC more than 50.   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 No Overt free air.  Evidence of colovesical fistula  Hospital Course: Patient was direct admitted to [facility] from clinic. Informed consent was obtained and documented, and patient underwent uneventful Hartman's Procedure and repair of colovesical fistula (Dr Everlene Farrier, 02/06/2023).  Post-operatively, patient did very well without any acute events. Advancement of patient's diet and  ambulation were well-tolerated. The remainder of patient's hospital course was essentially unremarkable, and discharge planning was initiated accordingly with patient safely able to be discharged home with appropriate discharge instructions, antibiotics (Augmentin x11 days to complete 14 total), pain control, and outpatient follow-up after all of his questions were answered to his expressed satisfaction.   Discharge Condition: Good    Physical Examination:  Constitutional: alert, cooperative and no distress  Respiratory: breathing non-labored at rest  Cardiovascular: regular rate and sinus rhythm  Gastrointestinal: soft, incisional soreness, non-distended, no rebound/guarding. Surgical drain in RLQ; output serosanguinous. Colostomy in left mid-abdomen; there is gas and stool in bag Genitourinary: Foley in place; good UO Integumentary: Laparotomy is intact with staples and penrose (REMOVED), no erythema, no drainage    Allergies as of 02/09/2023   No Known Allergies      Medication List     TAKE these medications    amLODipine 5 MG tablet Commonly known as: NORVASC Take 1 tablet (5 mg total) by mouth daily.   amoxicillin-clavulanate 875-125 MG tablet Commonly known as: AUGMENTIN Take 1 tablet by mouth 2 (two) times daily for 11 days.   atorvastatin 20 MG tablet Commonly known as: LIPITOR Take 20 mg by mouth daily. Admits to periodic noncompliance   olmesartan 40 MG tablet Commonly known as: BENICAR Take 40 mg by mouth daily.   ondansetron 4 MG disintegrating tablet Commonly known as: ZOFRAN-ODT Take 1 tablet (4 mg total) by mouth every 6 (six) hours as needed for nausea.   oxyCODONE 5 MG immediate release tablet Commonly known as: Oxy IR/ROXICODONE Take 1 tablet (5 mg total) by mouth every 6 (six) hours as needed for breakthrough pain  or severe pain.          Follow-up Information     Donovan Kail, PA-C. Go on 02/21/2023.   Specialty: Physician  Assistant Why: Go to appointment on 10/09 at 245 PM Contact information: [address] [place] [address] [phone]         Sondra Come, MD. Go on 02/22/2023.   Specialty: Urology Why: Go to appointment on 10/10 at  1145AM Contact information: [address] [place] [address] [phone]                  Time spent on discharge management including discussion of hospital course, clinical condition, outpatient instructions, prescriptions, and follow up with the patient and members of the medical team: >30 minutes  -- [name] , PA-C [facility]  02/09/2023, 8:29 AM [phone] M-F: 7am - 4pm

## 2023-02-09 NOTE — Plan of Care (Signed)
The patient has been discharged. IV has been removed. Drain teaching and care completed with the patient on foley care, leg bag change, ostomy bag change, jp drain teaching and wound care. The patient has been wheeled down to his car.  Problem: Education: Goal: Knowledge of General Education information will improve Description: Including pain rating scale, medication(s)/side effects and non-pharmacologic comfort measures Outcome: Completed/Met   Problem: Health Behavior/Discharge Planning: Goal: Ability to manage health-related needs will improve Outcome: Completed/Met   Problem: Clinical Measurements: Goal: Ability to maintain clinical measurements within normal limits will improve Outcome: Completed/Met Goal: Will remain free from infection Outcome: Completed/Met Goal: Diagnostic test results will improve Outcome: Completed/Met Goal: Respiratory complications will improve Outcome: Completed/Met Goal: Cardiovascular complication will be avoided Outcome: Completed/Met   Problem: Activity: Goal: Risk for activity intolerance will decrease Outcome: Completed/Met   Problem: Nutrition: Goal: Adequate nutrition will be maintained Outcome: Completed/Met   Problem: Coping: Goal: Level of anxiety will decrease Outcome: Completed/Met   Problem: Elimination: Goal: Will not experience complications related to bowel motility Outcome: Completed/Met Goal: Will not experience complications related to urinary retention Outcome: Completed/Met   Problem: Pain Managment: Goal: General experience of comfort will improve Outcome: Completed/Met   Problem: Safety: Goal: Ability to remain free from injury will improve Outcome: Completed/Met   Problem: Skin Integrity: Goal: Risk for impaired skin integrity will decrease Outcome: Completed/Met   Problem: Increased Nutrient Needs (NI-5.1) Goal: Food and/or nutrient delivery Description: Individualized approach for food/nutrient  provision. Outcome: Completed/Met

## 2023-02-09 NOTE — TOC Transition Note (Signed)
Transition of Care [facility]) - CM/SW Discharge Note   Patient Details  Name: Jeremiah Villarreal MRN: [id number] Date of Birth: 1965/07/09  [facility]) CM/SW Contact:  Darolyn Rua, LCSW Phone Number: 02/09/2023, 9:21 AM   Clinical Narrative:     Patient to discharge today home with self care, per PA, no discharge needs from [facility].   TOC signing off.   Final next level of care: Home/Self Care Barriers to Discharge: No Barriers Identified   Patient Goals and CMS Choice CMS Medicare.gov Compare Post Acute Care list provided to:: Patient Choice offered to / list presented to : Patient  Discharge Placement                         Discharge Plan and Services Additional resources added to the After Visit Summary for                                       Social Determinants of Health (SDOH) Interventions SDOH Screenings   Food Insecurity: No Food Insecurity (02/05/2023)  Housing: Low Risk  (02/05/2023)  Transportation Needs: No Transportation Needs (02/05/2023)  Utilities: Not At Risk (02/05/2023)  Financial Resource Strain: Patient Declined (11/29/2022)   Received from [facility]  Tobacco Use: High Risk (02/06/2023)     Readmission Risk Interventions     No data to display

## 2023-02-11 LAB — AEROBIC/ANAEROBIC CULTURE W GRAM STAIN (SURGICAL/DEEP WOUND)

## 2023-02-12 ENCOUNTER — Telehealth: Payer: Self-pay | Admitting: *Deleted

## 2023-02-12 NOTE — Telephone Encounter (Signed)
Faxed FMLA to [phone]

## 2023-02-13 ENCOUNTER — Other Ambulatory Visit: Payer: Self-pay | Admitting: Urology

## 2023-02-13 ENCOUNTER — Encounter (INDEPENDENT_AMBULATORY_CARE_PROVIDER_SITE_OTHER): Payer: Self-pay | Admitting: Vascular Surgery

## 2023-02-13 ENCOUNTER — Ambulatory Visit (INDEPENDENT_AMBULATORY_CARE_PROVIDER_SITE_OTHER): Payer: BC Managed Care – PPO | Admitting: Vascular Surgery

## 2023-02-13 ENCOUNTER — Telehealth (INDEPENDENT_AMBULATORY_CARE_PROVIDER_SITE_OTHER): Payer: Self-pay | Admitting: Vascular Surgery

## 2023-02-13 VITALS — BP 124/80 | HR 84 | Resp 18 | Ht 73.0 in | Wt 174.0 lb

## 2023-02-13 DIAGNOSIS — I7143 Infrarenal abdominal aortic aneurysm, without rupture: Secondary | ICD-10-CM | POA: Diagnosis not present

## 2023-02-13 DIAGNOSIS — I745 Embolism and thrombosis of iliac artery: Secondary | ICD-10-CM

## 2023-02-13 DIAGNOSIS — I10 Essential (primary) hypertension: Secondary | ICD-10-CM | POA: Diagnosis not present

## 2023-02-13 DIAGNOSIS — S3720XD Unspecified injury of bladder, subsequent encounter: Secondary | ICD-10-CM

## 2023-02-13 NOTE — Patient Instructions (Signed)
Abdominal Aortic Aneurysm  An aneurysm is a bulge in an artery. It happens when blood pushes against a weak or damaged artery wall. An abdominal aortic aneurysm (AAA) is an aneurysm in the lower part of the aorta. The aorta is the main artery of the body. It supplies blood from the heart to the rest of the body. Most aneurysms do not cause symptoms. Some can cause problems. An AAA can cause two serious problems: It can grow and then burst (rupture). It can cause blood to flow between the layers of the wall of the aorta through a tear (aortic dissection). These problems are medical emergencies. They can cause bleeding inside the body. They should be treated right away. What are the causes? The exact cause of this condition is not known. What increases the risk? You may be more likely to develop an AAA if: You are male and 31 years of age or older. You are Caucasian. You use or have used nicotine or tobacco products. You have a family history of aneurysms. You have an injury or trauma to your aorta. You are obese. You have: Arteriosclerosis. This is when your arteries harden. Arteritis. This is inflammation of the walls of an artery. Certain genetic conditions. Infectious aortitis. This is an infection in the wall of your aorta caused by bacteria. High cholesterol. High blood pressure (hypertension). What are the signs or symptoms? Symptoms depend on how big the aneurysm is and how fast it is growing. Most grow slowly and do not cause symptoms. In some cases, you may have: Severe pain in your abdomen, side, or lower back. A feeling of fullness after you eat only a little bit of food. A throbbing lump in your abdomen. Painful feet or toes. You may also have discolored skin or sores on your feet or toes. Constipation or trouble peeing (urinating). If your AAA bursts, you may: Feel sudden, severe pain in your abdomen, side, or back. Have nausea or vomiting. Feel light-headed or  faint. How is this diagnosed? This condition may be diagnosed with a physical exam to check for throbbing and listen to blood flow in your abdomen. You may also have tests, such as: Ultrasound. X-rays. CT scan. MRI. Angiogram. This test checks your arteries for damage or blockage. Because most AAAs that have not burst do not cause symptoms, they are often found during exams for other conditions. How is this treated? Treatment depends on: The size of your aneurysm. How fast your aneurysm is growing. Your age. Risk factors for a burst AAA. If your aneurysm is smaller than 2 inches (5 cm), your health care provider may: Keep an eye on it to see if it gets bigger. You may need to have an ultrasound every 6-12 months, every year, or every few years. Give you medicines to control blood pressure, treat pain, or fight infection. If your aneurysm is larger than 2 inches (5 cm), you may need surgery. Follow these instructions at home: Eating and drinking  Eat a heart-healthy diet. This includes lots of fresh fruits and vegetables, whole grains, low-fat (lean) protein, and low-fat dairy products. Avoid foods that are high in saturated fat and cholesterol. These include red meat and some dairy products. Lifestyle  Do not use any products that contain nicotine or tobacco. These products include cigarettes, chewing tobacco, and vaping devices, such as e-cigarettes. If you need help quitting, ask your provider. Check your blood pressure often. Follow instructions on how to keep it within normal limits. Have your  cholesterol levels checked often. Follow instructions on how to keep levels within normal limits. Stay active. Exercise on a regular basis. Talk with your provider about how often to exercise and which types of exercise are safe for you. Maintain a healthy weight. Alcohol use Do not drink alcohol if: Your provider tells you not to drink. You are pregnant, may be pregnant, or plan to  become pregnant. If you drink alcohol: Limit how much you have to: 0-1 drink a day if you are male. 0-2 drinks a day if you are male. Know how much alcohol is in your drink. In the [place] one drink is one 12 oz bottle of beer (355 mL), one 5 oz glass of wine (148 mL), or one 1 oz glass of hard liquor (44 mL). General instructions Take over-the-counter and prescription medicines only as told by your provider. You may have to avoid lifting. Ask your provider how much you can safely lift. If you can, learn your family's health history. Keep all follow-up visits. Your provider will need to watch the size of your aneurysm and how fast it is growing. Where to find more information American Heart Association: heart.org Contact a health care provider if: You have pain in your abdomen, side, or back. You have a throbbing mass in your abdomen. Your heart beats fast when you stand. You have nausea or vomiting. You are constipated or have trouble peeing. You have a fever. Get help right away if: You have sudden, severe pain in your abdomen, side, or back. You feel light-headed, or you faint. You have sweaty, clammy skin. You are short of breath. These symptoms may be an emergency. Get help right away. Call 911. Do not wait to see if the symptoms will go away. Do not drive yourself to the hospital. This information is not intended to replace advice given to you by your health care provider. Make sure you discuss any questions you have with your health care provider. Document Revised: 12/06/2021 Document Reviewed: 12/06/2021 Elsevier Patient Education  2024 [employer].

## 2023-02-13 NOTE — Telephone Encounter (Signed)
ATC pt to schedule appt from this morning at check out - VM not set up and unable to LM.  6-8 weeks no studies. see jd/fb

## 2023-02-13 NOTE — Assessment & Plan Note (Signed)
blood pressure control important in reducing the progression of atherosclerotic disease and aneurysmal disease. On appropriate oral medications.  

## 2023-02-13 NOTE — Progress Notes (Signed)
Patient ID: Jeremiah Villarreal, male   DOB: 1965-05-21, 57 y.o.   MRN: [id number]  Chief Complaint  Patient presents with   New Patient (Initial Visit)    np. consult.  regarding a AAA.  He had a CT on 01/19/2023    HPI Jeremiah Villarreal is a 57 y.o. male.  I am asked to see the patient by Dr. Everlene Farrier for evaluation of an abdominal aortic aneurysm as well as left iliac artery occlusive disease.  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  He has now had a diverting colostomy to divert the flow of stool.  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  He had drainage for the abscesses and has been on antibiotics.  As part of his workup, about 4 weeks ago he underwent a CT scan of the abdomen pelvis which I have independently reviewed.  From a vascular standpoint, he has a 5.1 cm infrarenal abdominal aortic aneurysm.  The left common iliac artery has a short segment occlusion that appears largely thrombotic.  The right common iliac artery also aneurysmal and greater than 3.5 cm in diameter. Does not have any obvious aneurysm related symptoms such as back or abdominal pain or signs of peripheral embolization.  He does describe significant hip and buttock claudication symptoms more on the left than the right.   Past Medical History:  Diagnosis Date   Hypertension     Past Surgical History:  Procedure Laterality Date   APPENDECTOMY N/A 02/06/2023   Procedure: APPENDECTOMY;  Surgeon: Leafy Ro, MD;  Location: [facility];  Service: General;  Laterality: N/A;   BLADDER REPAIR N/A 02/06/2023   Procedure: BLADDER REPAIR;  Surgeon: Leafy Ro, MD;  Location: [facility];  Service: General;  Laterality: N/A;   COLECTOMY WITH COLOSTOMY CREATION/HARTMANN PROCEDURE N/A 02/06/2023   Procedure: COLECTOMY WITH COLOSTOMY CREATION/HARTMANN PROCEDURE, WITH TAKEDOWN OF SPLENIC FLEXTURE;  Surgeon: Leafy Ro, MD;  Location: [facility];  Service: General;   Laterality: N/A;   VENTRAL HERNIA REPAIR N/A 02/06/2023   Procedure: HERNIA REPAIR VENTRAL ADULT;  Surgeon: Leafy Ro, MD;  Location: [facility];  Service: General;  Laterality: N/A;     Family History No bleeding disorders, clotting disorders, aneurysms, or autoimmune diseases.   Social History   Tobacco Use   Smoking status: Every Day    Current packs/day: 1.00    Average packs/day: 1 pack/day for 42.7 years (42.7 ttl pk-yrs)    Types: Cigarettes    Start date: 05/15/1980   Smokeless tobacco: Never  Vaping Use   Vaping status: Never Used  Substance Use Topics   Alcohol use: Yes    Alcohol/week: 6.0 standard drinks of alcohol    Types: 6 Shots of liquor per week    Comment: slowly decreasing amount of drinking   Drug use: Never     No Known Allergies  Current Outpatient Medications  Medication Sig Dispense Refill   amLODipine (NORVASC) 5 MG tablet Take 1 tablet (5 mg total) by mouth daily. 30 tablet 2   amoxicillin-clavulanate (AUGMENTIN) 875-125 MG tablet Take 1 tablet by mouth 2 (two) times daily for 11 days. 22 tablet 0   atorvastatin (LIPITOR) 20 MG tablet Take 20 mg by mouth daily. Admits to periodic noncompliance     olmesartan (BENICAR) 40 MG tablet Take 40 mg by mouth daily.     ondansetron (ZOFRAN-ODT) 4 MG disintegrating tablet Take 1 tablet (  4 mg total) by mouth every 6 (six) hours as needed for nausea. 20 tablet 0   oxyCODONE (OXY IR/ROXICODONE) 5 MG immediate release tablet Take 1 tablet (5 mg total) by mouth every 6 (six) hours as needed for breakthrough pain or severe pain. 20 tablet 0   No current facility-administered medications for this visit.      REVIEW OF SYSTEMS (Negative unless checked)  Constitutional: [] Weight loss  [] Fever  [] Chills Cardiac: [] Chest pain   [] Chest pressure   [] Palpitations   [] Shortness of breath when laying flat   [] Shortness of breath at rest   [] Shortness of breath with exertion. Vascular:  [x] Pain in legs with walking    [] Pain in legs at rest   [] Pain in legs when laying flat   [x] Claudication   [] Pain in feet when walking  [] Pain in feet at rest  [] Pain in feet when laying flat   [] History of DVT   [] Phlebitis   [] Swelling in legs   [] Varicose veins   [] Non-healing ulcers Pulmonary:   [] Uses home oxygen   [] Productive cough   [] Hemoptysis   [] Wheeze  [] COPD   [] Asthma Neurologic:  [] Dizziness  [] Blackouts   [] Seizures   [] History of stroke   [] History of TIA  [] Aphasia   [] Temporary blindness   [] Dysphagia   [] Weakness or numbness in arms   [] Weakness or numbness in legs Musculoskeletal:  [x] Arthritis   [] Joint swelling   [] Joint pain   [] Low back pain Hematologic:  [] Easy bruising  [] Easy bleeding   [] Hypercoagulable state   [] Anemic  [] Hepatitis Gastrointestinal:  [] Blood in stool   [] Vomiting blood  [] Gastroesophageal reflux/heartburn   [x] Abdominal pain Genitourinary:  [] Chronic kidney disease   [] Difficult urination  [] Frequent urination  [] Burning with urination   [] Hematuria Skin:  [] Rashes   [] Ulcers   [] Wounds Psychological:  [] History of anxiety   []  History of major depression.    Physical Exam BP 124/80 (BP Location: Left Arm)   Pulse 84   Resp 18   Ht 6\' 1"  (1.854 m)   Wt 174 lb (78.9 kg)   BMI 22.96 kg/m  Gen:  WD/WN, NAD Head: [place]/AT, No temporalis wasting.  Ear/Nose/Throat: Hearing grossly intact, nares w/o erythema or drainage, oropharynx w/o Erythema/Exudate Eyes: Conjunctiva clear, sclera non-icteric  Neck: trachea midline.  No JVD.  Pulmonary:  Good air movement, respirations not labored, no use of accessory muscles  Cardiac: RRR, no JVD Vascular:  Vessel Right Left  Radial Palpable Palpable                          DP Palpable  Not palpable   PT Palpable  Not palpable    Gastrointestinal:.  Diverting colostomy and Foley catheter in place with abdominal surgical scars healing well. Musculoskeletal: M/S 5/5 throughout.  Extremities without ischemic changes.  No deformity  or atrophy.  Trace lower extremity edema. Neurologic: Sensation grossly intact in extremities.  Symmetrical.  Speech is fluent. Motor exam as listed above. Psychiatric: Judgment intact, Mood & affect appropriate for pt's clinical situation. Dermatologic: No rashes or ulcers noted.  No cellulitis or open wounds.    Radiology CT ABDOMEN PELVIS W CONTRAST  Result Date: 01/19/2023 CLINICAL DATA:  Abdominal pain, acute, nonlocalized hematuria w/ dyuria as well as LLQ pain and Left flank pain EXAM: CT ABDOMEN AND PELVIS WITH CONTRAST TECHNIQUE: Multidetector CT imaging of the abdomen and pelvis was performed using the standard protocol following bolus administration of  intravenous contrast. RADIATION DOSE REDUCTION: This exam was performed according to the departmental dose-optimization program which includes automated exposure control, adjustment of the mA and/or kV according to patient size and/or use of iterative reconstruction technique. CONTRAST:  OMNIPAQUE IOHEXOL 300 MG/ML  SOLN COMPARISON:  None Available. FINDINGS: Lower chest: Right basilar subpleural reticulations, possibly related to chronic aspiration. Hepatobiliary: Hepatic steatosis. No focal liver lesions are visualized. There is focal fatty sparing around the gallbladder fossa. No evidence of cholelithiasis or cholecystitis. Portal veins are contrast opacify. Pancreas: No evidence of peripancreatic fat stranding to suggest pancreatitis. No evidence of pancreatic ductal dilatation. Spleen: Normal in size without focal abnormality. Adrenals/Urinary Tract: Bilateral adrenal glands are normal in appearance. Bilateral kidneys are normal in size without evidence hydronephrosis or nephrolithiasis. There is focal wall thickening anteriorly along the dome of the bladder (series 2, image 72). There is also a small locule of air along the anti dependent portion of the bladder. There is soft tissue stranding surrounding this area of wall thickening.  Stomach/Bowel: There is diverticulosis with evidence of acute on chronic sigmoid diverticulitis. 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 The appendix is normal in appearance. Vascular/Lymphatic: There is an infrarenal abdominal aortic aneurysm which extends into the right common iliac artery (series 2, image 59). The infrarenal component of the aneurysm measures up to 5.1 x 4.5 cm. The component extending into the right external iliac artery measures up to 2.6 x 3.6 cm. 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 Reproductive: Prostate is unremarkable. Other: No abdominal wall hernia or abnormality. No abdominopelvic ascites. Musculoskeletal: Bilateral pars defects at L5. IMPRESSION: 1. Acute on chronic sigmoid diverticulitis with multiple fistulous connections in the left lower quadrant, including a fistulous connection to the anterior abdominal wall, a likely colocolic fistula, and a colovesical fistula. 2. Infrarenal abdominal aortic aneurysm which extends into the right common iliac artery. The infrarenal component of the aneurysm measures up to 5.1 x 4.5 cm. The component extending into the right external iliac artery measures up to 2.6 x 3.6 cm. Recommend vascular surgery consultation. 3. 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 4. Hepatic steatosis. Electronically Signed   By: Lorenza Cambridge M.D.   On: 01/19/2023 18:19    Labs Recent Results (from the past 2160 hour(s))  Comprehensive metabolic panel     Status: Abnormal   Collection Time: 01/19/23  1:07 PM  Result Value Ref Range   Sodium 138 135 - 145 mmol/L   Potassium 4.0 3.5 - 5.1 mmol/L    Chloride 100 98 - 111 mmol/L   CO2 25 22 - 32 mmol/L   Glucose, Bld 110 (H) 70 - 99 mg/dL    Comment: Glucose reference range applies only to samples taken after fasting for at least 8 hours.   BUN 14 6 - 20 mg/dL   Creatinine, Ser 1.61 0.61 - 1.24 mg/dL   Calcium 9.1 8.9 - 09.6 mg/dL   Total Protein 7.5 6.5 - 8.1 g/dL   Albumin 3.4 (L) 3.5 - 5.0 g/dL   AST 21 15 - 41 U/L   ALT 29 0 - 44 U/L   Alkaline Phosphatase 96 38 -  126 U/L   Total Bilirubin 1.0 0.3 - 1.2 mg/dL   GFR, Estimated >60 >45 mL/min    Comment: (NOTE) Calculated using the CKD-EPI Creatinine Equation (2021)    Anion gap 13 5 - 15    Comment: Performed at [facility], [address] [place], [place] [address]  CBC     Status: Abnormal   Collection Time: 01/19/23  1:07 PM  Result Value Ref Range   WBC 14.3 (H) 4.0 - 10.5 K/uL   RBC 4.42 4.22 - 5.81 MIL/uL   Hemoglobin 15.6 13.0 - 17.0 g/dL   HCT 19.1 47.8 - 29.5 %   MCV 101.6 (H) 80.0 - 100.0 fL   MCH 35.3 (H) 26.0 - 34.0 pg   MCHC 34.7 30.0 - 36.0 g/dL   RDW 62.1 30.8 - 65.7 %   Platelets 379 150 - 400 K/uL   nRBC 0.0 0.0 - 0.2 %    Comment: Performed at [facility], [address] [place], [place] [address]  Type and screen [facility]     Status: None   Collection Time: 01/19/23  1:07 PM  Result Value Ref Range   ABO/RH(D) O POS    Antibody Screen NEG    Sample Expiration      01/22/2023,2359 Performed at [facility], [address] [place], [place] [address]   Protime-INR - (order if Patient is taking Coumadin / Warfarin)     Status: None   Collection Time: 01/19/23  1:07 PM  Result Value Ref Range   Prothrombin Time 14.0 11.4 - 15.2 seconds   INR 1.1 0.8 - 1.2    Comment: (NOTE) INR goal varies based on device and disease states. Performed at [facility], [address] [place], [place] [address]   Urinalysis, w/ Reflex to Culture (Infection Suspected) -Urine, Clean Catch      Status: Abnormal   Collection Time: 01/19/23  3:58 PM  Result Value Ref Range   Specimen Source URINE, CLEAN CATCH    Color, Urine YELLOW (A) YELLOW   APPearance CLOUDY (A) CLEAR   Specific Gravity, Urine 1.018 1.005 - 1.030   pH 5.0 5.0 - 8.0   Glucose, UA NEGATIVE NEGATIVE mg/dL   Hgb urine dipstick MODERATE (A) NEGATIVE   Bilirubin Urine NEGATIVE NEGATIVE   Ketones, ur 5 (A) NEGATIVE mg/dL   Protein, ur NEGATIVE NEGATIVE mg/dL   Nitrite NEGATIVE NEGATIVE   Leukocytes,Ua LARGE (A) NEGATIVE   RBC / HPF 21-50 0 - 5 RBC/hpf   WBC, UA >50 0 - 5 WBC/hpf    Comment:        Reflex urine culture not performed if WBC <=10, OR if Squamous epithelial cells >5. If Squamous epithelial cells >5 suggest recollection.    Bacteria, UA NONE SEEN NONE SEEN   Squamous Epithelial / HPF 0-5 0 - 5 /HPF   WBC Clumps PRESENT    Mucus PRESENT     Comment: Performed at [facility], [address] [place], [place] [address]  Urine Culture     Status: None   Collection Time: 01/19/23  3:58 PM   Specimen: Urine, Random  Result Value Ref Range   Specimen Description      URINE, RANDOM Performed at [facility], [address], [place], [place] [address]    Special Requests      NONE Reflexed from [id number] Performed at [facility], [address] [place] [place], [place] [address]  Culture      NO GROWTH Performed at [facility], [address], [place], [place] [address]    Report Status 01/20/2023 FINAL   Comprehensive metabolic panel     Status: Abnormal   Collection Time: 01/20/23  6:08 AM  Result Value Ref Range   Sodium 139 135 - 145 mmol/L   Potassium 3.5 3.5 - 5.1 mmol/L   Chloride 105 98 - 111 mmol/L   CO2 25 22 - 32 mmol/L   Glucose, Bld 122 (H) 70 - 99 mg/dL    Comment: Glucose reference range applies only to samples taken after fasting for at least 8 hours.   BUN 9 6 - 20 mg/dL   Creatinine, Ser 6.04 0.61 - 1.24 mg/dL   Calcium 8.1 (L) 8.9 - 10.3  mg/dL   Total Protein 6.4 (L) 6.5 - 8.1 g/dL   Albumin 3.1 (L) 3.5 - 5.0 g/dL   AST 20 15 - 41 U/L   ALT 24 0 - 44 U/L   Alkaline Phosphatase 83 38 - 126 U/L   Total Bilirubin 0.4 0.3 - 1.2 mg/dL   GFR, Estimated >54 >09 mL/min    Comment: (NOTE) Calculated using the CKD-EPI Creatinine Equation (2021)    Anion gap 9 5 - 15    Comment: Performed at [facility], [address] [place], [place] [address]  CBC     Status: Abnormal   Collection Time: 01/20/23  6:08 AM  Result Value Ref Range   WBC 9.5 4.0 - 10.5 K/uL   RBC 3.98 (L) 4.22 - 5.81 MIL/uL   Hemoglobin 14.2 13.0 - 17.0 g/dL   HCT 47.8 29.5 - 62.1 %   MCV 100.0 80.0 - 100.0 fL   MCH 35.7 (H) 26.0 - 34.0 pg   MCHC 35.7 30.0 - 36.0 g/dL   RDW 30.8 65.7 - 84.6 %   Platelets 343 150 - 400 K/uL   nRBC 0.0 0.0 - 0.2 %    Comment: Performed at [facility], [address] [place], [place] [address]  HIV Antibody (routine testing w rflx)     Status: None   Collection Time: 01/20/23  6:08 AM  Result Value Ref Range   HIV Screen 4th Generation wRfx Non Reactive Non Reactive    Comment: Performed at [facility], [address], [place], [place] [address]  Magnesium     Status: None   Collection Time: 01/21/23  5:51 AM  Result Value Ref Range   Magnesium 2.0 1.7 - 2.4 mg/dL    Comment: Performed at [facility], [address] [place], [place] [address]  CBC     Status: Abnormal   Collection Time: 01/21/23  5:51 AM  Result Value Ref Range   WBC 8.6 4.0 - 10.5 K/uL   RBC 3.97 (L) 4.22 - 5.81 MIL/uL   Hemoglobin 14.1 13.0 - 17.0 g/dL   HCT 02.7 25.3 - 66.4 %   MCV 101.5 (H) 80.0 - 100.0 fL   MCH 35.5 (H) 26.0 - 34.0 pg   MCHC 35.0 30.0 - 36.0 g/dL   RDW 40.3 47.4 - 25.9 %   Platelets 322 150 - 400 K/uL   nRBC 0.0 0.0 - 0.2 %    Comment: Performed at [facility], [address] [place], [place] [address]  Protime-INR     Status: None   Collection Time: 01/21/23  5:51 AM   Result Value Ref Range   Prothrombin  Time 14.6 11.4 - 15.2 seconds   INR 1.1 0.8 - 1.2    Comment: (NOTE) INR goal varies based on device and disease states. Performed at [facility], [address] [place], [place] [address]   Comprehensive metabolic panel     Status: Abnormal   Collection Time: 01/21/23  5:51 AM  Result Value Ref Range   Sodium 137 135 - 145 mmol/L   Potassium 3.8 3.5 - 5.1 mmol/L   Chloride 107 98 - 111 mmol/L   CO2 24 22 - 32 mmol/L   Glucose, Bld 115 (H) 70 - 99 mg/dL    Comment: Glucose reference range applies only to samples taken after fasting for at least 8 hours.   BUN 7 6 - 20 mg/dL   Creatinine, Ser 6.04 0.61 - 1.24 mg/dL   Calcium 8.4 (L) 8.9 - 10.3 mg/dL   Total Protein 6.4 (L) 6.5 - 8.1 g/dL   Albumin 2.9 (L) 3.5 - 5.0 g/dL   AST 26 15 - 41 U/L   ALT 26 0 - 44 U/L   Alkaline Phosphatase 73 38 - 126 U/L   Total Bilirubin 1.0 0.3 - 1.2 mg/dL   GFR, Estimated >54 >09 mL/min    Comment: (NOTE) Calculated using the CKD-EPI Creatinine Equation (2021)    Anion gap 6 5 - 15    Comment: Performed at [facility], [address] [place], [place] [address]  Urinalysis, Routine w reflex microscopic -Urine, Unspecified Source     Status: Abnormal   Collection Time: 02/05/23  6:52 PM  Result Value Ref Range   Color, Urine AMBER (A) YELLOW    Comment: BIOCHEMICALS MAY BE AFFECTED BY COLOR   APPearance HAZY (A) CLEAR   Specific Gravity, Urine 1.025 1.005 - 1.030   pH 5.0 5.0 - 8.0   Glucose, UA NEGATIVE NEGATIVE mg/dL   Hgb urine dipstick SMALL (A) NEGATIVE   Bilirubin Urine NEGATIVE NEGATIVE   Ketones, ur 5 (A) NEGATIVE mg/dL   Protein, ur 30 (A) NEGATIVE mg/dL   Nitrite NEGATIVE NEGATIVE   Leukocytes,Ua MODERATE (A) NEGATIVE   RBC / HPF 6-10 0 - 5 RBC/hpf   WBC, UA >50 0 - 5 WBC/hpf   Bacteria, UA NONE SEEN NONE SEEN   Squamous Epithelial / HPF 0 0 - 5 /HPF   Mucus PRESENT    Hyaline Casts, UA PRESENT     Comment:  Performed at [facility], [address] [place], [place] [address]  Surgical PCR screen     Status: None   Collection Time: 02/05/23  6:52 PM   Specimen: Urine, Unspecified Source; Nasal Swab  Result Value Ref Range   MRSA, PCR NEGATIVE NEGATIVE   Staphylococcus aureus NEGATIVE NEGATIVE    Comment: (NOTE) The Xpert SA Assay (FDA approved for NASAL specimens in patients 54 years of age and older), is one component of a comprehensive surveillance program. It is not intended to diagnose infection nor to guide or monitor treatment. Performed at [facility], [address] [place], [place] [address]   Surgical pathology     Status: None   Collection Time: 02/06/23 12:00 AM  Result Value Ref Range   SURGICAL PATHOLOGY      SURGICAL PATHOLOGY [facility] [address] [place], [place] [address] Telephone [phone] or [phone] Fax [phone]  REPORT OF SURGICAL PATHOLOGY   Accession #: [id number] Patient Name: APOLINAR, BERO Visit # : [id number]  MRN: [id number] Physician: Sterling Big DOB/Age 18-Aug-1965 (Age: 54) Gender: M Collected Date: 02/06/2023 Received Date: 02/06/2023  FINAL DIAGNOSIS       1. Hernia sac,  :       - MESOTHELIAL LINED FIBROADIPOSE TISSUE CONSISTENT WITH CLINICAL IMPRESSION OF      HERNIA SAC.      - NEGATIVE FOR MALIGNANCY.       2. Colon, segmental resection, sigmoid :       - DIVERTICULOSIS.      - SEROSAL FIBROSIS.      - SEVEN REACTIVE LYMPH NODES.      - UNREMARKABLE MARGINS OF RESECTION.      - NEGATIVE FOR DYSPLASIA AND MALIGNANCY.       3. Appendix, Other than Incidental,  :       - APPENDIX WITH SESSILE SERRATED POLYP AND REACTIVE LYMPHOID HYPERPLASIA.      - NEGATIVE FOR DYSPLASIA AND MALIGNANCY.        ELECTRONIC SIGNATURE : Rubinas Md, Delice Bison , Sports administrator, Electronic Signature  MICROSCOPIC DESCRIPTION  CASE COMMENTS STAINS USED IN  DIAGNOSIS: H&E H&E H&E H&E H&E H&E H&E    CLINICAL HISTORY  SPECIMEN(S) OBTAINED 1. Hernia sac, 2. Colon, segmental resection, Sigmoid 3. Appendix, Other than Incidental,  SPECIMEN COMMENTS: SPECIMEN CLINICAL INFORMATION: 1. Diverticulosis, appendicitis    Gross Description 1. Received in formalin labeled with the patient's name and "Hernia sac" is a 4.0 x 3.0 x 2.1 cm saccular piece of fibromembranous tissue exuding yellow-tan fibrofatty tissue.Representative sections are submitted in cassette 1A. 2. Received in formalin labeled with the patient's name and "Sigmoid colon" is an unoriented 26.7 cm segment of intestine with an average circumference of 6.5 cm and up to 3.2 cm of attached mesocolon.Brown adhesions cover the red-tan serosa.Opening reveals tan, normally folded mucosa with a multitude of diverticular o utpouchings along the length with some extending through the wall.In the attached fibrofatty tissue are eight tan, rubbery possible lymph nodes ranging from 0.4-0.7 cm in greatest dimension.      Block Summary      2A: resection margins, en face      2B: diverticulum that extends through wall      2C: additional diverticula      2D: four whole nodes      2E: four whole nodes 3. Specimen: received in formalin labeled with the patient's name and "Appendix" is an intact vermiform appendix      Size: 8.7 x 0.7 cm with up to 0.7 cm of mesoappendix.      Serosa: red-tan and shaggy with adhesions.      Mucosa: pink-tan and unremarkable.      Wall: 0.2 cm thick and indurated without a discrete transmural defect.      Lumen: patent and filled with green fecal material. A fecalith is not grossly      identified.      Block Summary: representative sections, to include the proximal margin (inked      black) and half of the distal tip, are submitted in a single cassette.  (LEF       02/07/23)        Report signed out from the following location(s) [place]. [facility] [address] [place], [place] [address] CLIA #: [id number]  [facility] [address] [place], [place] [address] CLIA #: [id number]   CBC     Status: Abnormal   Collection Time: 02/06/23  4:29 AM  Result  Value Ref Range   WBC 11.4 (H) 4.0 - 10.5 K/uL   RBC 4.11 (L) 4.22 - 5.81 MIL/uL   Hemoglobin 14.1 13.0 - 17.0 g/dL   HCT 23.5 57.3 - 22.0 %   MCV 101.9 (H) 80.0 - 100.0 fL   MCH 34.3 (H) 26.0 - 34.0 pg   MCHC 33.7 30.0 - 36.0 g/dL   RDW 25.4 27.0 - 62.3 %   Platelets 332 150 - 400 K/uL   nRBC 0.0 0.0 - 0.2 %    Comment: Performed at [facility], [address] [place], [place] [address]  Comprehensive metabolic panel     Status: Abnormal   Collection Time: 02/06/23  4:29 AM  Result Value Ref Range   Sodium 138 135 - 145 mmol/L   Potassium 3.7 3.5 - 5.1 mmol/L   Chloride 105 98 - 111 mmol/L   CO2 25 22 - 32 mmol/L   Glucose, Bld 109 (H) 70 - 99 mg/dL    Comment: Glucose reference range applies only to samples taken after fasting for at least 8 hours.   BUN 14 6 - 20 mg/dL   Creatinine, Ser 1.51 0.61 - 1.24 mg/dL   Calcium 8.2 (L) 8.9 - 10.3 mg/dL   Total Protein 6.1 (L) 6.5 - 8.1 g/dL   Albumin 3.0 (L) 3.5 - 5.0 g/dL   AST 23 15 - 41 U/L   ALT 19 0 - 44 U/L   Alkaline Phosphatase 58 38 - 126 U/L   Total Bilirubin 0.9 0.3 - 1.2 mg/dL   GFR, Estimated >76 >16 mL/min    Comment: (NOTE) Calculated using the CKD-EPI Creatinine Equation (2021)    Anion gap 8 5 - 15    Comment: Performed at [facility], [address] [place], [place] [address]  Magnesium     Status: None   Collection Time: 02/06/23  4:29 AM  Result Value Ref Range   Magnesium 1.8 1.7 - 2.4 mg/dL    Comment: Performed at [facility], [address] [place], [place] [address]  Phosphorus     Status: None   Collection Time: 02/06/23  4:29 AM  Result Value Ref Range   Phosphorus 3.1 2.5 - 4.6 mg/dL    Comment: Performed at [facility], [address] [place], [place] [address]  Aerobic/Anaerobic Culture w Gram Stain (surgical/deep wound)     Status: None   Collection Time: 02/06/23  7:15 AM   Specimen: Path fluid; Body Fluid  Result Value Ref Range   Specimen Description      FLUID Performed at [facility], [address] [place], [place] [address]    Special Requests      NONE Performed at [facility], [address] [place], [place] [address]    Gram Stain      FEW WBC PRESENT,BOTH PMN AND MONONUCLEAR NO ORGANISMS SEEN    Culture      FEW CANDIDA GLABRATA NO ANAEROBES ISOLATED Performed at [facility], [address] [place], [place] [address]    Report Status 02/11/2023 FINAL   CBC     Status: Abnormal   Collection Time: 02/07/23  3:42 AM  Result Value Ref Range   WBC 10.9 (H) 4.0 - 10.5 K/uL   RBC 3.85 (L) 4.22 - 5.81 MIL/uL   Hemoglobin 13.4 13.0 - 17.0 g/dL   HCT 37.1 69.6 - 78.9 %   MCV 102.9 (H) 80.0 - 100.0 fL  MCH 34.8 (H) 26.0 - 34.0 pg   MCHC 33.8 30.0 - 36.0 g/dL   RDW 84.6 96.2 - 95.2 %   Platelets 312 150 - 400 K/uL   nRBC 0.0 0.0 - 0.2 %    Comment: Performed at [facility], [address] [place], [place] [address]  Comprehensive metabolic panel     Status: Abnormal   Collection Time: 02/07/23  3:42 AM  Result Value Ref Range   Sodium 138 135 - 145 mmol/L   Potassium 3.6 3.5 - 5.1 mmol/L   Chloride 111 98 - 111 mmol/L   CO2 21 (L) 22 - 32 mmol/L   Glucose, Bld 108 (H) 70 - 99 mg/dL    Comment: Glucose reference range applies only to samples taken after fasting for at least 8 hours.   BUN 8 6 - 20 mg/dL   Creatinine, Ser 4.40 (L) 0.61 - 1.24 mg/dL   Calcium 7.6 (L) 8.9 - 10.3 mg/dL   Total Protein 5.7 (L) 6.5 - 8.1 g/dL   Albumin 2.9 (L) 3.5 - 5.0 g/dL   AST 21 15 - 41 U/L   ALT 18 0 - 44 U/L   Alkaline Phosphatase 44 38 - 126 U/L   Total Bilirubin 1.0 0.3 - 1.2 mg/dL   GFR, Estimated >10 >27 mL/min    Comment:  (NOTE) Calculated using the CKD-EPI Creatinine Equation (2021)    Anion gap 6 5 - 15    Comment: Performed at [facility], [address] [place], [place] [address]  Magnesium     Status: None   Collection Time: 02/07/23  3:42 AM  Result Value Ref Range   Magnesium 1.8 1.7 - 2.4 mg/dL    Comment: Performed at [facility], [address] [place], [place] [address]  CBC     Status: Abnormal   Collection Time: 02/08/23  6:04 AM  Result Value Ref Range   WBC 10.0 4.0 - 10.5 K/uL   RBC 3.66 (L) 4.22 - 5.81 MIL/uL   Hemoglobin 12.6 (L) 13.0 - 17.0 g/dL   HCT 74.2 (L) 59.5 - 63.8 %   MCV 102.2 (H) 80.0 - 100.0 fL   MCH 34.4 (H) 26.0 - 34.0 pg   MCHC 33.7 30.0 - 36.0 g/dL   RDW 75.6 43.3 - 29.5 %   Platelets 326 150 - 400 K/uL   nRBC 0.0 0.0 - 0.2 %    Comment: Performed at [facility], [address], [place], [place] [address]  Basic metabolic panel     Status: Abnormal   Collection Time: 02/08/23  6:04 AM  Result Value Ref Range   Sodium 140 135 - 145 mmol/L   Potassium 3.4 (L) 3.5 - 5.1 mmol/L   Chloride 112 (H) 98 - 111 mmol/L   CO2 23 22 - 32 mmol/L   Glucose, Bld 88 70 - 99 mg/dL    Comment: Glucose reference range applies only to samples taken after fasting for at least 8 hours.   BUN 9 6 - 20 mg/dL   Creatinine, Ser 6.60 0.61 - 1.24 mg/dL   Calcium 7.8 (L) 8.9 - 10.3 mg/dL   GFR, Estimated >63 >01 mL/min    Comment: (NOTE) Calculated using the CKD-EPI Creatinine Equation (2021)    Anion gap 5 5 - 15    Comment: Performed at [facility], [address] [place], [place] [address]  CBC     Status: Abnormal   Collection Time: 02/09/23  3:29 AM  Result Value Ref Range   WBC 9.6 4.0 - 10.5 K/uL   RBC 3.67 (L) 4.22 - 5.81 MIL/uL   Hemoglobin 12.8 (L) 13.0 - 17.0 g/dL   HCT 16.1 (L) 09.6 - 04.5 %   MCV 99.5 80.0 - 100.0 fL   MCH 34.9 (H) 26.0 - 34.0 pg   MCHC 35.1 30.0 - 36.0 g/dL   RDW 40.9 81.1 - 91.4 %   Platelets 340 150  - 400 K/uL   nRBC 0.0 0.0 - 0.2 %    Comment: Performed at [facility], [address], [place], [place] [address]  Basic metabolic panel     Status: Abnormal   Collection Time: 02/09/23  3:29 AM  Result Value Ref Range   Sodium 139 135 - 145 mmol/L   Potassium 3.5 3.5 - 5.1 mmol/L   Chloride 106 98 - 111 mmol/L   CO2 25 22 - 32 mmol/L   Glucose, Bld 91 70 - 99 mg/dL    Comment: Glucose reference range applies only to samples taken after fasting for at least 8 hours.   BUN 8 6 - 20 mg/dL   Creatinine, Ser 6.21 (L) 0.61 - 1.24 mg/dL   Calcium 8.3 (L) 8.9 - 10.3 mg/dL   GFR, Estimated >30 >86 mL/min    Comment: (NOTE) Calculated using the CKD-EPI Creatinine Equation (2021)    Anion gap 8 5 - 15    Comment: Performed at [facility], [address] [place], [place] [address]    Assessment/Plan:  AAA (abdominal aortic aneurysm) (HCC) As part of his workup, about 4 weeks ago he underwent a CT scan of the abdomen pelvis which I have independently reviewed.  From a vascular standpoint, he has a 5.1 cm infrarenal abdominal aortic aneurysm.  The left common iliac artery has a short segment occlusion that appears largely thrombotic.  The right common iliac artery also aneurysmal and greater than 3.5 cm in diameter. Both his aorta and his right common iliac artery above the threshold for prophylactic repair, and repair of his left iliac artery occlusive disease would improve his claudication symptoms as well.  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  An infectious complication with a stent graft would be a clear life-threatening problem and not 1 we would likely be able to fix easily.  I have recommended we give his process is about 6 to 8 weeks and further evaluate and discuss repair of his aneurysm and occlusive disease at that time.  If we can cross his left iliac occlusion,  I suspect we can place a conventional stent graft.  If this is difficult to cross or impossible, we may have to consider an aorta uni iliac device with a femoral to femoral bypass.  Patient will return in 6 to 8 weeks to discuss surgical therapy further.  Essential hypertension blood pressure control important in reducing the progression of atherosclerotic disease and aneurysmal disease. On appropriate oral medications.   Iliac artery occlusion, left (HCC) See above.  Would be addressed at the same time as his aneurysm repair.  Likely is causing significant claudication symptoms.      Festus Barren 02/13/2023, 5:35 PM   This note was created with Dragon medical transcription system.  Any errors from dictation are unintentional.

## 2023-02-13 NOTE — Assessment & Plan Note (Signed)
As part of his workup, about 4 weeks ago he underwent a CT scan of the abdomen pelvis which I have independently reviewed.  From a vascular standpoint, he has a 5.1 cm infrarenal abdominal aortic aneurysm.  The left common iliac artery has a short segment occlusion that appears largely thrombotic.  The right common iliac artery also aneurysmal and greater than 3.5 cm in diameter. Both his aorta and his right common iliac artery above the threshold for prophylactic repair, and repair of his left iliac artery occlusive disease would improve his claudication symptoms as well.  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  An infectious complication with a stent graft would be a clear life-threatening problem and not 1 we would likely be able to fix easily.  I have recommended we give his process is about 6 to 8 weeks and further evaluate and discuss repair of his aneurysm and occlusive disease at that time.  If we can cross his left iliac occlusion, I suspect we can place a conventional stent graft.  If this is difficult to cross or impossible, we may have to consider an aorta uni iliac device with a femoral to femoral bypass.  Patient will return in 6 to 8 weeks to discuss surgical therapy further.

## 2023-02-13 NOTE — Assessment & Plan Note (Signed)
See above.  Would be addressed at the same time as his aneurysm repair.  Likely is causing significant claudication symptoms.

## 2023-02-21 ENCOUNTER — Encounter: Payer: Self-pay | Admitting: Physician Assistant

## 2023-02-21 ENCOUNTER — Ambulatory Visit (INDEPENDENT_AMBULATORY_CARE_PROVIDER_SITE_OTHER): Payer: BC Managed Care – PPO | Admitting: Physician Assistant

## 2023-02-21 ENCOUNTER — Other Ambulatory Visit (HOSPITAL_COMMUNITY): Payer: BC Managed Care – PPO

## 2023-02-21 VITALS — BP 143/83 | HR 80 | Temp 98.7°F | Ht 73.0 in | Wt 174.0 lb

## 2023-02-21 DIAGNOSIS — Z09 Encounter for follow-up examination after completed treatment for conditions other than malignant neoplasm: Secondary | ICD-10-CM

## 2023-02-21 DIAGNOSIS — K5720 Diverticulitis of large intestine with perforation and abscess without bleeding: Secondary | ICD-10-CM

## 2023-02-21 DIAGNOSIS — K5732 Diverticulitis of large intestine without perforation or abscess without bleeding: Secondary | ICD-10-CM

## 2023-02-21 DIAGNOSIS — N321 Vesicointestinal fistula: Secondary | ICD-10-CM

## 2023-02-21 DIAGNOSIS — K632 Fistula of intestine: Secondary | ICD-10-CM

## 2023-02-21 NOTE — Progress Notes (Signed)
[facility] POST-OP OFFICE VISIT  02/21/2023  HPI: Jeremiah Villarreal is a 57 y.o. male 15 days s/p exploratory laparotomy, colectomy with end colostomy creation, repair of colovesical fistula, and appendectomy   He is doing very well given the circumstances No abdominal pain No fever, chills, nausea, emesis He is tolerating PO; ostomy functioning without issue. He has no problem managing this Drain with <10 ccs daily; serous Ambulating without issue   Has follow up tomorrow with urology for colovesical fistula repair check and hopefully foley removal  He has also seen vascular surgery regarding his AAA. Plan for follow up in 6-8 weeks to discuss definitive repair once fully recovered from this insult.   Vital signs: BP (!) 143/83 (BP Location: Left Arm, Patient Position: Sitting, Cuff Size: Normal)   Pulse 80   Temp 98.7 F (37.1 C)   Ht 6\' 1"  (1.854 m)   Wt 174 lb (78.9 kg)   SpO2 97%   BMI 22.96 kg/m    Physical Exam: Constitutional: Well appearing male, NAD Abdomen: Soft, non-tender, non-distended, no rebound/guarding. Colostomy in left mid-abdomen; pink, gas and stool in bag. Surgical drain in right abdomen; output serous (REMOVED) Genitourinary: Foley in place; good UO Skin: Laparotomy is CDI with staples (removed); no erythema  Assessment/Plan: This is a 57 y.o. male 15 days s/p exploratory laparotomy, colectomy with end colostomy creation, repair of colovesical fistula, and appendectomy    - Removed surgical drain; dressing placed. Leave in place x48 hours   - Removed staples; steri-strips placed  - Continue foley catheter; follow up with urology as scheduled tomorrow (10/10)  - Pain control prn; no issues   - Reviewed wound care recommendation; okay to shower   - Reviewed lifting restrictions; 6 weeks total  - Reviewed surgical pathologies;  - Diverticulitis, negative for malignancy.  - Appendix w/ polyp, negative for malignancy   - reviewed rationale  for waiting at least 6 months typically prior to reversal; He is understanding   - He will follow up on 10/30 with Dr Everlene Farrier; He understands to call with questions/concerns  -- [name], PA-C [facility] 02/21/2023, 3:14 PM M-F: 7am - 4pm

## 2023-02-21 NOTE — Patient Instructions (Signed)

## 2023-02-22 ENCOUNTER — Ambulatory Visit: Payer: Self-pay | Admitting: Urology

## 2023-02-22 ENCOUNTER — Ambulatory Visit
Admission: RE | Admit: 2023-02-22 | Discharge: 2023-02-22 | Disposition: A | Discharge status: Home / Self Care | Payer: BC Managed Care – PPO | Source: Ambulatory Visit | Attending: Urology | Admitting: Urology

## 2023-02-22 ENCOUNTER — Ambulatory Visit (INDEPENDENT_AMBULATORY_CARE_PROVIDER_SITE_OTHER): Payer: BC Managed Care – PPO | Admitting: Urology

## 2023-02-22 ENCOUNTER — Encounter: Payer: Self-pay | Admitting: Urology

## 2023-02-22 VITALS — Ht 73.0 in | Wt 170.0 lb

## 2023-02-22 DIAGNOSIS — X58XXXD Exposure to other specified factors, subsequent encounter: Secondary | ICD-10-CM | POA: Insufficient documentation

## 2023-02-22 DIAGNOSIS — Z125 Encounter for screening for malignant neoplasm of prostate: Secondary | ICD-10-CM

## 2023-02-22 DIAGNOSIS — Z466 Encounter for fitting and adjustment of urinary device: Secondary | ICD-10-CM

## 2023-02-22 DIAGNOSIS — S3720XD Unspecified injury of bladder, subsequent encounter: Secondary | ICD-10-CM | POA: Diagnosis not present

## 2023-02-22 MED ORDER — SULFAMETHOXAZOLE-TRIMETHOPRIM 800-160 MG PO TABS
1.0000 | ORAL_TABLET | Freq: Once | ORAL | Status: AC
Start: 2023-02-22 — End: 2023-02-22
  Administered 2023-02-22: 1 via ORAL

## 2023-02-22 MED ORDER — IOTHALAMATE MEGLUMINE 17.2 % UR SOLN
250.0000 mL | Freq: Once | URETHRAL | Status: AC | PRN
  Administered 2023-02-22: 200 mL via INTRAVESICAL

## 2023-02-22 NOTE — Progress Notes (Signed)
   02/22/23 1:16 PM   Jeremiah Villarreal 1965-08-21 [id number]  CC: Colovesical fistula, PSA screening  HPI: 57 year old male who originally presented in early September 2024 with complicated diverticulitis and was initially managed with antibiotics, ultimately underwent Hartman's procedure with repair of colovesical fistula by Dr. Everlene Farrier on 02/06/2023.  At that time a 5 mm colovesical fistula was noted at the dome of the bladder and was repaired in 2 layers in watertight fashion by Dr. Everlene Farrier, and urology was contacted to arrange follow-up regarding Foley management.  He had a cystogram performed earlier today, which on my review shows no evidence of leak.  PSA in July 2024 was normal at 1.3.   PMH: Past Medical History:  Diagnosis Date   Hypertension     Surgical History: Past Surgical History:  Procedure Laterality Date   APPENDECTOMY N/A 02/06/2023   Procedure: APPENDECTOMY;  Surgeon: Leafy Ro, MD;  Location: [facility] ORS;  Service: General;  Laterality: N/A;   BLADDER REPAIR N/A 02/06/2023   Procedure: BLADDER REPAIR;  Surgeon: Leafy Ro, MD;  Location: [facility] ORS;  Service: General;  Laterality: N/A;   COLECTOMY WITH COLOSTOMY CREATION/HARTMANN PROCEDURE N/A 02/06/2023   Procedure: COLECTOMY WITH COLOSTOMY CREATION/HARTMANN PROCEDURE, WITH TAKEDOWN OF SPLENIC FLEXTURE;  Surgeon: Leafy Ro, MD;  Location: [facility] ORS;  Service: General;  Laterality: N/A;   VENTRAL HERNIA REPAIR N/A 02/06/2023   Procedure: HERNIA REPAIR VENTRAL ADULT;  Surgeon: Leafy Ro, MD;  Location: [facility] ORS;  Service: General;  Laterality: N/A;   Family History: No family history on file.  Social History:  reports that he has been smoking cigarettes. He started smoking about 42 years ago. He has a 42.8 pack-year smoking history. He has never used smokeless tobacco. He reports current alcohol use of about 6.0 standard drinks of alcohol per week. He reports that he does not use drugs.  Physical Exam: Ht 6'  1" (1.854 m)   Wt 170 lb (77.1 kg)   BMI 22.43 kg/m    Constitutional:  Alert and oriented, No acute distress. Cardiovascular: No clubbing, cyanosis, or edema. Respiratory: Normal respiratory effort, no increased work of breathing. GI: Abdomen is soft, nontender, nondistended, no abdominal masses Foley with yellow urine  Pertinent Imaging: I have personally viewed and interpreted the cystogram today that shows no evidence of leak.  Assessment & Plan:   57 year old male with complicated diverticulitis with pelvic abscess and colovesical fistula, underwent colostomy creation with general surgery as well as repair of small 5 mm colovesical fistula.  I reviewed the operative notes from general surgery and his case with Dr. Everlene Farrier.  Cystogram today shows no evidence of leak, and Foley catheter was removed.  Bactrim was given for prophylaxis.  Reassurance provided regarding normal PSA level.  Return precautions were discussed.  Follow-up with urology as needed   Legrand Rams, MD 02/22/2023  [facility] [address], [address] [place], [place] [address] [phone]

## 2023-03-07 ENCOUNTER — Telehealth: Payer: Self-pay | Admitting: *Deleted

## 2023-03-07 NOTE — Telephone Encounter (Signed)
Faxed FMLA to [employer] at [phone]

## 2023-03-14 ENCOUNTER — Ambulatory Visit: Payer: BC Managed Care – PPO | Admitting: Physician Assistant

## 2023-03-14 ENCOUNTER — Encounter: Payer: BC Managed Care – PPO | Admitting: Surgery

## 2023-03-14 ENCOUNTER — Encounter: Payer: Self-pay | Admitting: Physician Assistant

## 2023-03-14 ENCOUNTER — Telehealth: Payer: Self-pay

## 2023-03-14 VITALS — BP 149/80 | HR 84 | Temp 98.0°F | Ht 73.0 in | Wt 174.6 lb

## 2023-03-14 DIAGNOSIS — K5732 Diverticulitis of large intestine without perforation or abscess without bleeding: Secondary | ICD-10-CM

## 2023-03-14 DIAGNOSIS — K632 Fistula of intestine: Secondary | ICD-10-CM

## 2023-03-14 DIAGNOSIS — Z09 Encounter for follow-up examination after completed treatment for conditions other than malignant neoplasm: Secondary | ICD-10-CM

## 2023-03-14 DIAGNOSIS — N321 Vesicointestinal fistula: Secondary | ICD-10-CM

## 2023-03-14 NOTE — Progress Notes (Unsigned)
[facility] POST-OP OFFICE VISIT  03/15/2023  HPI: Jeremiah Villarreal is a 57 y.o. male 36 days s/p exploratory laparotomy, colectomy with end colostomy creation, repair of colovesical fistula, and appendectomy   He continues to do remarkably well No significant abdominal pain No fever, chills, nausea, emesis Ostomy is working; no issues with management Incision is well healed Ambulating without issue Tolerating PO Wants to get back to work   Vital signs: BP (!) 149/80   Pulse 84   Temp 98 F (36.7 C)   Ht 6\' 1"  (1.854 m)   Wt 174 lb 9.6 oz (79.2 kg)   SpO2 97%   BMI 23.04 kg/m    Physical Exam: Constitutional: Well appearing male, NAD Abdomen: Soft, non-tender, non-distended, no rebound/guarding. Colostomy in left abdomen; pink; patent, gas and stool in bag Skin: Laparotomy is well healed   Assessment/Plan: This is a 57 y.o. male 36 days s/p exploratory laparotomy, colectomy with end colostomy creation, repair of colovesical fistula, and appendectomy    - Pain control prn; OTC medications should be sufficient   - Monitor ostomy output; call as needed - happy to refer to ostomy clinic if needed  - Reviewed wound care recommendation  - Reviewed lifting restrictions; He has completed these - return to work note given   - Encouraged follow up with vascular surgery regarding AAA as scheduled. Would encourage repair of this prior to reversal   - Discussed briefly ext steps regarding reversal; Will need colonoscopy prior   - We will follow up in ~3 months to discuss reversal and next steps; He understands to call with questions/concerns in the interim   -- [name], PA-C [facility] 03/15/2023, 9:39 AM M-F: 7am - 4pm

## 2023-03-14 NOTE — Telephone Encounter (Signed)
Spoke with patient about to from Dr. Everlene Farrier to [facility] schedule at 4:00 p.m.  Patient was okay as long as he Jeremiah Villarreal can release him to go back to work.  Patient wanted me to put a note in to the clinical team but also advised patient he could mention it in his office visit

## 2023-03-14 NOTE — Patient Instructions (Signed)

## 2023-04-02 ENCOUNTER — Other Ambulatory Visit: Payer: Self-pay

## 2023-04-02 DIAGNOSIS — K5732 Diverticulitis of large intestine without perforation or abscess without bleeding: Secondary | ICD-10-CM

## 2023-05-03 ENCOUNTER — Telehealth: Payer: Self-pay

## 2023-05-03 ENCOUNTER — Other Ambulatory Visit: Payer: Self-pay

## 2023-05-03 DIAGNOSIS — K5732 Diverticulitis of large intestine without perforation or abscess without bleeding: Secondary | ICD-10-CM

## 2023-05-03 DIAGNOSIS — Z1211 Encounter for screening for malignant neoplasm of colon: Secondary | ICD-10-CM

## 2023-05-03 MED ORDER — NA SULFATE-K SULFATE-MG SULF 17.5-3.13-1.6 GM/177ML PO SOLN
1.0000 | Freq: Once | ORAL | 0 refills | Status: AC
Start: 2023-05-03 — End: 2023-05-03

## 2023-05-03 NOTE — Telephone Encounter (Signed)
Gastroenterology Pre-Procedure Review  Request Date: 05/22/23 Requesting Physician: Dr. Allegra Lai  PATIENT REVIEW QUESTIONS: The patient responded to the following health history questions as indicated:    1. Are you having any GI issues? 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 Do you have a personal history of Polyps? no 3. Do you have a family history of Colon Cancer or Polyps? no 4. Diabetes Mellitus? no 5. Joint replacements in the past 12 months?no 6. Major health problems in the past 3 months?no 7. Any artificial heart valves, MVP, or defibrillator?no    MEDICATIONS & ALLERGIES:    Patient reports the following regarding taking any anticoagulation/antiplatelet therapy:   Plavix, Coumadin, Eliquis, Xarelto, Lovenox, Pradaxa, Brilinta, or Effient? no Aspirin? no  Patient confirms/reports the following medications:  Current Outpatient Medications  Medication Sig Dispense Refill   amLODipine (NORVASC) 5 MG tablet Take 1 tablet (5 mg total) by mouth daily. 30 tablet 2   atorvastatin (LIPITOR) 20 MG tablet Take 20 mg by mouth daily. Admits to periodic noncompliance     olmesartan (BENICAR) 40 MG tablet Take 40 mg by mouth daily.     No current facility-administered medications for this visit.    Patient confirms/reports the following allergies:  No Known Allergies  No orders of the defined types were placed in this encounter.   AUTHORIZATION INFORMATION Primary Insurance: 1D#: Group #:  Secondary Insurance: 1D#: Group #:  SCHEDULE INFORMATION: Date: 05/22/23 Time: Location: [facility]

## 2023-05-07 ENCOUNTER — Telehealth: Payer: Self-pay

## 2023-05-07 NOTE — Telephone Encounter (Signed)
Patient has been contacted to reschedule his 05/22/23 colonoscopy with Dr.Vanga at [facility] due to anesthesia feels that he would be better suited for [facility]. Pt was contacted and colonoscopy has been rescheduled to [facility] with Dr. Allegra Lai 05/31/23.  Pt agreed to rescheduling.  Vikki in Endo notified. Instructions updated.  Referral updated.  Thanks,  [place], [place]

## 2023-05-24 ENCOUNTER — Encounter: Payer: Self-pay | Admitting: Gastroenterology

## 2023-05-30 ENCOUNTER — Encounter: Payer: Self-pay | Admitting: Gastroenterology

## 2023-05-31 ENCOUNTER — Ambulatory Visit: Payer: BC Managed Care – PPO | Admitting: Anesthesiology

## 2023-05-31 ENCOUNTER — Other Ambulatory Visit: Payer: Self-pay

## 2023-05-31 ENCOUNTER — Ambulatory Visit
Admission: RE | Admit: 2023-05-31 | Discharge: 2023-05-31 | Disposition: A | Discharge status: Home / Self Care | Payer: BC Managed Care – PPO | Attending: Gastroenterology | Admitting: Gastroenterology

## 2023-05-31 ENCOUNTER — Encounter: Payer: Self-pay | Admitting: Gastroenterology

## 2023-05-31 ENCOUNTER — Encounter
Admission: RE | Disposition: A | Discharge status: Home / Self Care | Payer: Self-pay | Source: Home / Self Care | Attending: Gastroenterology

## 2023-05-31 DIAGNOSIS — D125 Benign neoplasm of sigmoid colon: Secondary | ICD-10-CM

## 2023-05-31 DIAGNOSIS — Z09 Encounter for follow-up examination after completed treatment for conditions other than malignant neoplasm: Secondary | ICD-10-CM | POA: Diagnosis present

## 2023-05-31 DIAGNOSIS — Z9049 Acquired absence of other specified parts of digestive tract: Secondary | ICD-10-CM | POA: Diagnosis not present

## 2023-05-31 DIAGNOSIS — F17210 Nicotine dependence, cigarettes, uncomplicated: Secondary | ICD-10-CM | POA: Diagnosis not present

## 2023-05-31 DIAGNOSIS — K5730 Diverticulosis of large intestine without perforation or abscess without bleeding: Secondary | ICD-10-CM | POA: Insufficient documentation

## 2023-05-31 DIAGNOSIS — K635 Polyp of colon: Secondary | ICD-10-CM | POA: Insufficient documentation

## 2023-05-31 DIAGNOSIS — I10 Essential (primary) hypertension: Secondary | ICD-10-CM | POA: Insufficient documentation

## 2023-05-31 DIAGNOSIS — Z8719 Personal history of other diseases of the digestive system: Secondary | ICD-10-CM

## 2023-05-31 DIAGNOSIS — I739 Peripheral vascular disease, unspecified: Secondary | ICD-10-CM | POA: Diagnosis not present

## 2023-05-31 DIAGNOSIS — Z933 Colostomy status: Secondary | ICD-10-CM | POA: Diagnosis not present

## 2023-05-31 DIAGNOSIS — K5732 Diverticulitis of large intestine without perforation or abscess without bleeding: Secondary | ICD-10-CM

## 2023-05-31 HISTORY — PX: POLYPECTOMY: SHX149

## 2023-05-31 HISTORY — PX: COLONOSCOPY WITH PROPOFOL: SHX5780

## 2023-05-31 SURGERY — COLONOSCOPY WITH PROPOFOL
Anesthesia: General

## 2023-05-31 MED ORDER — DEXMEDETOMIDINE HCL IN NACL 80 MCG/20ML IV SOLN
INTRAVENOUS | Status: AC
Start: 2023-05-31 — End: ?
  Filled 2023-05-31: qty 20

## 2023-05-31 MED ORDER — LIDOCAINE HCL (PF) 2 % IJ SOLN
INTRAMUSCULAR | Status: AC
  Filled 2023-05-31: qty 5

## 2023-05-31 MED ORDER — PROPOFOL 10 MG/ML IV BOLUS
INTRAVENOUS | Status: DC | PRN
  Administered 2023-05-31 (×2): 50 mg via INTRAVENOUS

## 2023-05-31 MED ORDER — DEXMEDETOMIDINE HCL IN NACL 80 MCG/20ML IV SOLN
INTRAVENOUS | Status: DC | PRN
  Administered 2023-05-31: 12 ug via INTRAVENOUS

## 2023-05-31 MED ORDER — SODIUM CHLORIDE 0.9 % IV SOLN: INTRAVENOUS | Status: DC

## 2023-05-31 MED ORDER — METOPROLOL TARTRATE 5 MG/5ML IV SOLN
INTRAVENOUS | Status: AC
  Filled 2023-05-31: qty 5

## 2023-05-31 MED ORDER — PROPOFOL 500 MG/50ML IV EMUL
INTRAVENOUS | Status: DC | PRN
  Administered 2023-05-31: 75 ug/kg/min via INTRAVENOUS

## 2023-05-31 MED ORDER — LIDOCAINE HCL (CARDIAC) PF 100 MG/5ML IV SOSY
PREFILLED_SYRINGE | INTRAVENOUS | Status: DC | PRN
  Administered 2023-05-31: 80 mg via INTRAVENOUS

## 2023-05-31 NOTE — Transfer of Care (Signed)
Immediate Anesthesia Transfer of Care Note  Patient: Jeremiah Villarreal  Procedure(s) Performed: COLONOSCOPY WITH PROPOFOL POLYPECTOMY INTESTINAL  Patient Location: PACU  Anesthesia Type:General  Level of Consciousness: sedated  Airway & Oxygen Therapy: Patient Spontanous Breathing  Post-op Assessment: Report given to RN and Post -op Vital signs reviewed and stable  Post vital signs: Reviewed and stable  Last Vitals:  Vitals Value Taken Time  BP 105/92 05/31/23 1119  Temp 36.4 C 05/31/23 1118  Pulse 95 05/31/23 1122  Resp 19 05/31/23 1122  SpO2 94 % 05/31/23 1122  Vitals shown include unfiled device data.  Last Pain:  Vitals:   05/31/23 1118  TempSrc: Temporal  PainSc: 0-No pain         Complications: No notable events documented.

## 2023-05-31 NOTE — Anesthesia Postprocedure Evaluation (Signed)
Anesthesia Post Note  Patient: Jeremiah Villarreal  Procedure(s) Performed: COLONOSCOPY WITH PROPOFOL POLYPECTOMY INTESTINAL  Patient location during evaluation: PACU Anesthesia Type: General Level of consciousness: awake and alert Pain management: pain level controlled Vital Signs Assessment: post-procedure vital signs reviewed and stable Respiratory status: spontaneous breathing, nonlabored ventilation and respiratory function stable Cardiovascular status: blood pressure returned to baseline and stable Postop Assessment: no apparent nausea or vomiting Anesthetic complications: no   No notable events documented.   Last Vitals:  Vitals:   05/31/23 1118 05/31/23 1138  BP: (!) 105/92 (!) 117/93  Pulse: 94   Resp: 19   Temp: 36.4 C   SpO2: 95%     Last Pain:  Vitals:   05/31/23 1138  TempSrc:   PainSc: 0-No pain                 Foye Deer

## 2023-05-31 NOTE — H&P (Signed)
Jeremiah Repress, MD [address]  [address]  [place], [place] [address]  Main: [phone]  Fax: [phone] Pager: [phone]  Primary Care Physician:  Cyndia Diver, PA-C Primary Gastroenterologist:  Dr. Arlyss Villarreal  Pre-Procedure History & Physical: HPI:  Jeremiah Villarreal is a 58 y.o. male is here for an colonoscopy.   Past Medical History:  Diagnosis Date   Hypertension     Past Surgical History:  Procedure Laterality Date   APPENDECTOMY N/A 02/06/2023   Procedure: APPENDECTOMY;  Surgeon: Leafy Ro, MD;  Location: [facility] ORS;  Service: General;  Laterality: N/A;   BLADDER REPAIR N/A 02/06/2023   Procedure: BLADDER REPAIR;  Surgeon: Leafy Ro, MD;  Location: [facility] ORS;  Service: General;  Laterality: N/A;   COLECTOMY WITH COLOSTOMY CREATION/HARTMANN PROCEDURE N/A 02/06/2023   Procedure: COLECTOMY WITH COLOSTOMY CREATION/HARTMANN PROCEDURE, WITH TAKEDOWN OF SPLENIC FLEXTURE;  Surgeon: Leafy Ro, MD;  Location: [facility] ORS;  Service: General;  Laterality: N/A;   VENTRAL HERNIA REPAIR N/A 02/06/2023   Procedure: HERNIA REPAIR VENTRAL ADULT;  Surgeon: Leafy Ro, MD;  Location: [facility] ORS;  Service: General;  Laterality: N/A;    Prior to Admission medications   Medication Sig Start Date End Date Taking? Authorizing Provider  amLODipine (NORVASC) 5 MG tablet Take 1 tablet (5 mg total) by mouth daily. 08/29/22 02/13/24  Chesley Noon, MD  atorvastatin (LIPITOR) 20 MG tablet Take 20 mg by mouth daily. Admits to periodic noncompliance    [provider]  olmesartan (BENICAR) 40 MG tablet Take 40 mg by mouth daily. 01/05/23 01/05/24  [provider]    Allergies as of 05/03/2023   (No Known Allergies)    History reviewed. No pertinent family history.  Social History   Socioeconomic History   Marital status: Single    Spouse name: Not on file   Number of children: Not on file   Years of education: Not on file   Highest education level: Not  on file  Occupational History   Not on file  Tobacco Use   Smoking status: Every Day    Current packs/day: 1.00    Average packs/day: 1 pack/day for 43.0 years (43.0 ttl pk-yrs)    Types: Cigarettes    Start date: 05/15/1980    Passive exposure: Current   Smokeless tobacco: Never  Vaping Use   Vaping status: Never Used  Substance and Sexual Activity   Alcohol use: Yes    Alcohol/week: 6.0 standard drinks of alcohol    Types: 6 Shots of liquor per week    Comment: slowly decreasing amount of drinking   Drug use: Never   Sexual activity: Not on file  Other Topics Concern   Not on file  Social History Narrative   Not on file   Social Drivers of Health   Financial Resource Strain: Patient Declined (11/29/2022)   Received from [facility]   Overall Financial Resource Strain (CARDIA)    Difficulty of Paying Living Expenses: Patient declined  Food Insecurity: No Food Insecurity (02/05/2023)   Hunger Vital Sign    Worried About Running Out of Food in the Last Year: Never true    Ran Out of Food in the Last Year: Never true  Transportation Needs: No Transportation Needs (02/05/2023)   PRAPARE - Administrator, Civil Service (Medical): No    Lack of Transportation (Non-Medical): No  Physical Activity: Not on file  Stress: Not on file  Social Connections:  Not on file  Intimate Partner Violence: Not At Risk (02/05/2023)   Humiliation, Afraid, Rape, and Kick questionnaire    Fear of Current or Ex-Partner: No    Emotionally Abused: No    Physically Abused: No    Sexually Abused: No    Review of Systems: See HPI, otherwise negative ROS  Physical Exam: BP (!) 159/107   Pulse 90   Temp 97.6 F (36.4 C) (Temporal)   Wt 86.2 kg   SpO2 99%   BMI 25.07 kg/m  General:   Alert,  pleasant and cooperative in NAD Head:  Normocephalic and atraumatic. Neck:  Supple; no masses or thyromegaly. Lungs:  Clear throughout to auscultation.    Heart:  Regular rate  and rhythm. Abdomen:  Soft, nontender and nondistended. Normal bowel sounds, without guarding, and without rebound.   Neurologic:  Alert and  oriented x4;  grossly normal neurologically.  Impression/Plan: Jeremiah Villarreal is here for an colonoscopy to be performed for h/o colonic diverticulitis, end colostomy  Risks, benefits, limitations, and alternatives regarding  colonoscopy have been reviewed with the patient.  Questions have been answered.  All parties agreeable.   Lannette Donath, MD  05/31/2023, 10:36 AM

## 2023-05-31 NOTE — Op Note (Signed)
[facility] Gastroenterology Patient Name: Jeremiah Villarreal Procedure Date: 05/31/2023 10:37 AM MRN: [id number] Account #: [id number] Date of Birth: [birth year] Admit Type: Outpatient Age: 58 Room: [facility] ENDO ROOM 3 Gender: Male Note Status: Finalized Instrument Name: Prentice Docker [id number] Procedure:             Colonoscopy via Stoma with Endoscopy of Hartmann Pouch Indications:           History of diverticulitis s/p diverting colostomy with                         Gertie Gowda pouch Providers:             Toney Reil MD, MD Referring MD:          Cyndia Diver (Referring MD) Medicines:             General Anesthesia Complications:         No immediate complications. Estimated blood loss: None. Procedure:             Pre-Anesthesia Assessment:                        - Prior to the procedure, a History and Physical was                         performed, and patient medications and allergies were                         reviewed. The patient is competent. The risks and                         benefits of the procedure and the sedation options and                         risks were discussed with the patient. All questions                         were answered and informed consent was obtained.                         Patient identification and proposed procedure were                         verified by the physician, the nurse, the                         anesthesiologist, the anesthetist and the technician                         in the pre-procedure area in the procedure room in the                         endoscopy suite. Mental Status Examination: alert and                         oriented. Airway Examination: normal oropharyngeal                         airway and neck mobility. Respiratory Examination:  clear to auscultation. CV Examination: normal.                         Prophylactic Antibiotics: The patient does not require                          prophylactic antibiotics. Prior Anticoagulants: The                         patient has taken no anticoagulant or antiplatelet                         agents. ASA Grade Assessment: II - A patient with mild                         systemic disease. After reviewing the risks and                         benefits, the patient was deemed in satisfactory                         condition to undergo the procedure. The anesthesia                         plan was to use general anesthesia. Immediately prior                         to administration of medications, the patient was                         re-assessed for adequacy to receive sedatives. The                         heart rate, respiratory rate, oxygen saturations,                         blood pressure, adequacy of pulmonary ventilation, and                         response to care were monitored throughout the                         procedure. The physical status of the patient was                         re-assessed after the procedure.                        After obtaining informed consent, the endoscope was                         passed under direct vision. Throughout the procedure,                         the patient's blood pressure, pulse, and oxygen                         saturations were monitored continuously. The  Colonoscope was introduced through the anus and                         advanced to the the cecum, identified by appendiceal                         orifice and ileocecal valve. The procedure was                         performed without difficulty. The patient tolerated                         the procedure well. The quality of the bowel                         preparation was good. 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      The perianal and digital rectal examinations were normal.      Five sessile polyps were found in the  sigmoid colon and cecum. The       polyps were 3 to 7 mm in size. These polyps were removed with a cold       snare. Resection and retrieval were complete. Estimated blood loss was       minimal.      A few small-mouthed diverticula were found in the sigmoid colon.      The Hartmann pouch appeared normal. Impression:            - Five 3 to 7 mm polyps in the sigmoid colon and in                         the cecum, removed with a cold snare. Resected and                         retrieved.                        - Diverticulosis in the sigmoid colon.                        - The Hartmann pouch is normal. Recommendation:        - Discharge patient to home (with escort).                        - Resume previous diet today.                        - Await pathology results.                        - Repeat post-surgical lower GI endoscopy in 3 - 5                         years for surveillance.                        - Return to referring physician. Procedure Code(s):     --- Professional ---  [phone], Colonoscopy through stoma; with removal of                         tumor(s), polyp(s), or other lesion(s) by snare                         technique                        [phone], Colonoscopy, flexible; diagnostic, including                         collection of specimen(s) by brushing or washing, when                         performed (separate procedure) Diagnosis Code(s):     --- Professional ---                        D12.5, Benign neoplasm of sigmoid colon                        D12.0, Benign neoplasm of cecum                        Z93.3, Colostomy status                        K57.30, Diverticulosis of large intestine without                         perforation or abscess without bleeding CPT copyright 2022 American Medical Association. All rights reserved. The codes documented in this report are preliminary and upon coder review may  be revised to meet current  compliance requirements. Dr. Libby Maw Toney Reil MD, MD 05/31/2023 11:17:55 AM This report has been signed electronically. Number of Addenda: 0 Note Initiated On: 05/31/2023 10:37 AM Scope Withdrawal Time: 0 hours 15 minutes 19 seconds  Total Procedure Duration: 0 hours 19 minutes 34 seconds  Estimated Blood Loss:  Estimated blood loss: none.      [facility]

## 2023-05-31 NOTE — Anesthesia Preprocedure Evaluation (Addendum)
Anesthesia Evaluation  Patient identified by MRN, date of birth, ID band Patient awake    Reviewed: Allergy & Precautions, H&P , NPO status , Patient's Chart, lab work & pertinent test results, reviewed documented beta blocker date and time   Airway Mallampati: II  TM Distance: >3 FB Neck ROM: full    Dental  (+) Poor Dentition, Chipped   Pulmonary Current SmokerPatient did not abstain from smoking.   Pulmonary exam normal        Cardiovascular Exercise Tolerance: Good hypertension, On Medications + Peripheral Vascular Disease  Normal cardiovascular exam Rhythm:regular Rate:Normal     Neuro/Psych negative neurological ROS  negative psych ROS   GI/Hepatic negative GI ROS, Neg liver ROS,,,  Endo/Other  negative endocrine ROS    Renal/GU negative Renal ROS  negative genitourinary   Musculoskeletal   Abdominal Normal abdominal exam  (+)   Peds  Hematology negative hematology ROS (+)   Anesthesia Other Findings Past Medical History: No date: Hypertension  AAA (abdominal aortic aneurysm) (HCC) Acute diverticulitis Aortic aneurysm (HCC) Colovesical fistula Essential hypertension 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, left (HCC)    Reproductive/Obstetrics negative OB ROS                             Anesthesia Physical Anesthesia Plan  ASA: 2  Anesthesia Plan: General   Post-op Pain Management: Minimal or no pain anticipated   Induction: Intravenous  PONV Risk Score and Plan: 2  Airway Management Planned: Natural Airway  Additional Equipment:   Intra-op Plan:   Post-operative Plan:   Informed Consent: I have reviewed the patients History and Physical, chart, labs and discussed the procedure including the risks, benefits and alternatives for the proposed anesthesia with the patient or authorized representative who has indicated his/her  understanding and acceptance.     Dental Advisory Given  Plan Discussed with: CRNA  Anesthesia Plan Comments:        Anesthesia Quick Evaluation

## 2023-06-01 ENCOUNTER — Encounter: Payer: Self-pay | Admitting: Gastroenterology

## 2023-06-01 LAB — SURGICAL PATHOLOGY

## 2023-06-04 ENCOUNTER — Encounter: Payer: Self-pay | Admitting: Gastroenterology

## 2023-07-02 ENCOUNTER — Encounter: Payer: Self-pay | Admitting: Surgery

## 2023-07-02 ENCOUNTER — Ambulatory Visit: Payer: BC Managed Care – PPO | Admitting: Surgery

## 2023-07-02 VITALS — BP 140/76 | HR 82 | Temp 98.0°F | Ht 73.0 in | Wt 189.0 lb

## 2023-07-02 DIAGNOSIS — K5732 Diverticulitis of large intestine without perforation or abscess without bleeding: Secondary | ICD-10-CM | POA: Diagnosis not present

## 2023-07-02 DIAGNOSIS — Z433 Encounter for attention to colostomy: Secondary | ICD-10-CM | POA: Diagnosis not present

## 2023-07-02 DIAGNOSIS — I7143 Infrarenal abdominal aortic aneurysm, without rupture: Secondary | ICD-10-CM

## 2023-07-02 DIAGNOSIS — N321 Vesicointestinal fistula: Secondary | ICD-10-CM | POA: Diagnosis not present

## 2023-07-02 NOTE — Patient Instructions (Addendum)
 We will get you scheduled for a CT scan and Barium enema prior to surgery to better assess all areas.   We will refer you to Cardiology for an assessment prior to surgery. They will call you to schedule this.    We will have you follow up here after these tests and your Cardiology appointment.      You are scheduled for a CT scan at [facility] on 07/13/23. You will need to arrive at the Medical Mall entrance and check in at the Radiology desk at 12:15 pm.    You are scheduled for a Barium enema at [facility] on 07/20/23. You will need to arrive at the Medical Mall entrance at 8:15 am, and check in at the Radiology desk. You will need to have nothing to eat or drink after midnight the night prior.

## 2023-07-06 NOTE — Progress Notes (Signed)
 Outpatient Surgical Follow Up  07/06/2023  Ranvir Renovato is an 58 y.o. male.   Chief Complaint  Patient presents with   Follow-up    HPI: Lupita Leash is 5 months out from a Hartman's procedure from perforated diverticulitis with colovesical fistula.  He is doing well.  He is tolerating diet.  He is taking care of his own colostomy no evidence of obstruction no fevers no chills.  He did see vascular surgery and recommended EVAR's at some point in time.  He continues to smoke.  Has not seen cardiology yet.  Encouraged him to see cardiology and also to quit smoking Did complete a recent colonoscopy that I have personally reviewed showing no evidence of malignancies.  There is normal Hartmann stump  Past Medical History:  Diagnosis Date   Hyperlipidemia    Hypertension     Past Surgical History:  Procedure Laterality Date   APPENDECTOMY N/A 02/06/2023   Procedure: APPENDECTOMY;  Surgeon: Leafy Ro, MD;  Location: [facility] ORS;  Service: General;  Laterality: N/A;   BLADDER REPAIR N/A 02/06/2023   Procedure: BLADDER REPAIR;  Surgeon: Leafy Ro, MD;  Location: [facility] ORS;  Service: General;  Laterality: N/A;   COLECTOMY WITH COLOSTOMY CREATION/HARTMANN PROCEDURE N/A 02/06/2023   Procedure: COLECTOMY WITH COLOSTOMY CREATION/HARTMANN PROCEDURE, WITH TAKEDOWN OF SPLENIC FLEXTURE;  Surgeon: Leafy Ro, MD;  Location: [facility] ORS;  Service: General;  Laterality: N/A;   COLONOSCOPY WITH PROPOFOL N/A 05/31/2023   Procedure: COLONOSCOPY WITH PROPOFOL;  Surgeon: Toney Reil, MD;  Location: [facility] ENDOSCOPY;  Service: Endoscopy;  Laterality: N/A;   POLYPECTOMY  05/31/2023   Procedure: POLYPECTOMY INTESTINAL;  Surgeon: Toney Reil, MD;  Location: [facility] ENDOSCOPY;  Service: Endoscopy;;   VENTRAL HERNIA REPAIR N/A 02/06/2023   Procedure: HERNIA REPAIR VENTRAL ADULT;  Surgeon: Leafy Ro, MD;  Location: [facility] ORS;  Service: General;  Laterality: N/A;    No family history on file.  Social  History:  reports that he has been smoking cigarettes. He started smoking about 43 years ago. He has a 43.1 pack-year smoking history. He has been exposed to tobacco smoke. He has never used smokeless tobacco. He reports current alcohol use of about 6.0 standard drinks of alcohol per week. He reports that he does not use drugs.  Allergies: No Known Allergies  Medications reviewed.    ROS Full ROS performed and is otherwise negative other than what is stated in HPI   BP (!) 140/76   Pulse 82   Temp 98 F (36.7 C)   Ht 6\' 1"  (1.854 m)   Wt 189 lb (85.7 kg)   SpO2 99%   BMI 24.94 kg/m   Physical Exam Vitals and nursing note reviewed. Exam conducted with a chaperone present.  Constitutional:      Appearance: Normal appearance.  Cardiovascular:     Rate and Rhythm: Normal rate and regular rhythm.  Pulmonary:     Effort: Pulmonary effort is normal. No respiratory distress.     Breath sounds: Normal breath sounds.  Abdominal:     General: Bowel sounds are normal. There is no distension.     Palpations: Abdomen is soft. There is no mass.     Tenderness: There is no abdominal tenderness. There is no guarding.     Hernia: No hernia is present.     Comments: Colostomy in place wide and patent  Musculoskeletal:        General: Normal range of motion.  Cervical back: Normal range of motion and neck supple. No rigidity or tenderness.  Skin:    General: Skin is warm and dry.     Capillary Refill: Capillary refill takes less than 2 seconds.  Neurological:     General: No focal deficit present.     Mental Status: He is alert and oriented to person, place, and time.  Psychiatric:        Mood and Affect: Mood normal.        Behavior: Behavior normal.        Thought Content: Thought content normal.        Judgment: Judgment normal.      Assessment/Plan: Yousof is a 58 year old male with prior Hartman's procedure for colovesical fistula and perforated diverticulitis 5 months  ago.  He has recovered well.  He additionally has a AAA that we will need to be addressed at some point in time.  He has completed a colonoscopy that is good.  Prior to colostomy takedown I would like to make sure that he sees a cardiologist given his multiple risk factors including the AAA and his prior history of noncompliance.  Encouraged him to stop smoking and to try to optimize him before colostomy takedown.  I am not in a rush to do a colostomy takedown.  I was extremely candid with him regarding what this will entail and given that he is got multiple risk factors there is significant risk for anastomotic leak and other issues he does understand all this and wishes to move forward with CT scan, Gastrografin enema and another surgical evaluation.  He also is trying to quit smoking and modified his risk factors.  Please note that I spent 40 minutes in this encounter including extensive review of medical record, extensive counseling, placing orders and performing documentation   Sterling Big, MD [facility] General Surgeon

## 2023-07-13 ENCOUNTER — Ambulatory Visit
Admission: RE | Admit: 2023-07-13 | Discharge: 2023-07-13 | Disposition: A | Discharge status: Home / Self Care | Payer: BC Managed Care – PPO | Source: Ambulatory Visit | Attending: Surgery | Admitting: Surgery

## 2023-07-13 DIAGNOSIS — K5732 Diverticulitis of large intestine without perforation or abscess without bleeding: Secondary | ICD-10-CM | POA: Diagnosis present

## 2023-07-13 DIAGNOSIS — N321 Vesicointestinal fistula: Secondary | ICD-10-CM | POA: Diagnosis present

## 2023-07-13 DIAGNOSIS — K632 Fistula of intestine: Secondary | ICD-10-CM

## 2023-07-13 MED ORDER — IOHEXOL 300 MG/ML  SOLN
100.0000 mL | Freq: Once | INTRAMUSCULAR | Status: AC | PRN
  Administered 2023-07-13: 100 mL via INTRAVENOUS

## 2023-07-20 ENCOUNTER — Ambulatory Visit
Admission: RE | Admit: 2023-07-20 | Discharge: 2023-07-20 | Disposition: A | Discharge status: Home / Self Care | Payer: BC Managed Care – PPO | Source: Ambulatory Visit | Attending: Surgery | Admitting: Surgery

## 2023-07-20 DIAGNOSIS — K5732 Diverticulitis of large intestine without perforation or abscess without bleeding: Secondary | ICD-10-CM | POA: Insufficient documentation

## 2023-07-20 DIAGNOSIS — K632 Fistula of intestine: Secondary | ICD-10-CM | POA: Insufficient documentation

## 2023-07-20 MED ORDER — IOHEXOL 300 MG/ML  SOLN: 450.0000 mL | Freq: Once | INTRAMUSCULAR | Status: DC | PRN

## 2023-08-01 ENCOUNTER — Other Ambulatory Visit: Payer: Self-pay

## 2023-08-01 DIAGNOSIS — R599 Enlarged lymph nodes, unspecified: Secondary | ICD-10-CM

## 2023-08-06 ENCOUNTER — Ambulatory Visit: Payer: BC Managed Care – PPO | Admitting: Surgery

## 2023-08-09 ENCOUNTER — Other Ambulatory Visit: Payer: Self-pay

## 2023-08-09 DIAGNOSIS — I7143 Infrarenal abdominal aortic aneurysm, without rupture: Secondary | ICD-10-CM

## 2023-08-10 ENCOUNTER — Ambulatory Visit
Admission: RE | Admit: 2023-08-10 | Discharge: 2023-08-10 | Disposition: A | Discharge status: Home / Self Care | Source: Ambulatory Visit | Attending: Surgery | Admitting: Surgery

## 2023-08-10 DIAGNOSIS — R599 Enlarged lymph nodes, unspecified: Secondary | ICD-10-CM

## 2023-08-10 MED ORDER — IOHEXOL 300 MG/ML  SOLN
75.0000 mL | Freq: Once | INTRAMUSCULAR | Status: AC | PRN
  Administered 2023-08-10: 75 mL via INTRAVENOUS

## 2023-08-13 ENCOUNTER — Encounter: Payer: Self-pay | Admitting: Surgery

## 2023-08-13 ENCOUNTER — Ambulatory Visit (INDEPENDENT_AMBULATORY_CARE_PROVIDER_SITE_OTHER): Admitting: Surgery

## 2023-08-13 VITALS — BP 184/92 | HR 85 | Temp 98.5°F | Ht 73.0 in | Wt 202.0 lb

## 2023-08-13 DIAGNOSIS — K5732 Diverticulitis of large intestine without perforation or abscess without bleeding: Secondary | ICD-10-CM | POA: Diagnosis not present

## 2023-08-13 DIAGNOSIS — Z433 Encounter for attention to colostomy: Secondary | ICD-10-CM | POA: Diagnosis not present

## 2023-08-13 DIAGNOSIS — Z933 Colostomy status: Secondary | ICD-10-CM

## 2023-08-13 NOTE — Patient Instructions (Signed)
 We have spoken today about reversing your Ostomy. We will schedule this with Dr. Everlene Farrier at [facility].   If you are on any injectable weight loss medication, you will need to stop taking your GLP-1 injectable (weight loss) medications 8 days before your surgery to avoid any complications with anesthesia.   Plan on being in the hospital between 5-7 days after surgery. You will be started on a liquid diet and then advanced as tolerated prior to going home.  If you have any disability or FMLA paperwork that needs to be filled out for your employer, please bring this in prior to surgery or you may fax it to [phone].   To prep for your surgery, You will need to complete a bowel prep, 2 antibiotics, and a fleets enema prior to surgery. Your antibiotics are Neomycin and Metronidazole and these will be taken at 8am, 2pm, 8pm on the day of your prep. (Please see the Bowel sheet provided)  Please see your [facility]) Pre-Care Sheet for more information. Our surgery scheduler will call you to review surgery information and let you know about your surgery date. If you have any questions, please call our office and ask for a nurse.  End Colostomy Reversal An end colostomy reversal is surgery that reverses an end colostomy. The large intestine is disconnected from the opening in the abdomen (stoma). Then it is reconnected to the large intestine inside the body. A stoma and pouch are no longer needed. Bowel movements can resume through the rectum. LET [facility] CARE PROVIDER KNOW ABOUT: Allergies to food or medicine. Medicines taken, including vitamins, health supplements, herbs, eye drops, over-the-counter medicines, and creams. Use of steroids (by mouth or creams). Previous problems with anesthetics or numbing medicines. History of bleeding problems or blood clots. Previous surgery. Other health problems, including diabetes and kidney problems. Possibility of pregnancy, if this applies. RISKS AND  COMPLICATIONS General surgical complications may include the following: Reaction to anesthetics. Damage to surrounding nerves, tissues, or structures. Blood clot. Bleeding. Scarring. Specific risks for colostomy reversal, while rare, may include: Intestinal paralysis (ileus). This is a normal part of recovery. It usually goes away in 3-7 days. However, it can last longer in some people. Leaking at the joined part of the intestine (anastomotic leak). Infection of the surgical cut (incision) or the place where the stoma was located. A collection of pus (abscess) in the abdomen or pelvis. Intestinal blockage. Narrowing at the joined part of the intestine (stricture). Urinary and sexual dysfunction. BEFORE THE PROCEDURE It is important to follow your health care provider's instructions prior to your procedure. This will help you to avoid complications. Steps before your procedure may include: A physical exam, rectal exam, X-rays, colonoscopy, and other procedures. Chemotherapy or radiation therapy, if the stoma was created due to cancer. A review of the procedure, the anesthetic being used, and what to expect after the procedure. You may be asked to: Stop taking certain medicines for several days prior to your procedure. These may include blood thinners (such as aspirin). Take certain medicines, such as antibiotics or stool softeners. Avoid eating and drinking after midnight the night before the procedure. This will help you to avoid complications from the anesthetic. Quit smoking. Smoking increases the chances of a healing problem after your procedure. PROCEDURE You will be given medicine that makes you sleep (general anesthetic). The procedure may be done as open surgery, with a large incision. It may also be done as laparoscopic surgery, with several smaller  incisions. The surgeon will stitch or staple the intestine ends back together. This surgery takes several hours. AFTER THE  PROCEDURE You will be given pain medicine. Slowly increase your diet and movement as directed by your health care provider. You should arrange for someone to help you with activities at home while you recover.   This information is not intended to replace advice given to you by your health care provider. Make sure you discuss any questions you have with your health care provider.   Document Released: 07/24/2011 Document Revised: 09/15/2014 Document Reviewed: 07/24/2011 Elsevier Interactive Patient Education [employer]

## 2023-08-14 ENCOUNTER — Telehealth: Payer: Self-pay | Admitting: Surgery

## 2023-08-14 NOTE — Telephone Encounter (Signed)
 Patient has been advised of Pre-Admission date/time, and Surgery date at [facility].  Surgery Date: 08/28/23 Preadmission Testing Date: 08/20/23 (phone 8a-1p)  Patient has been made aware to call [phone], between 1-3:00pm the day before surgery, to find out what time to arrive for surgery.

## 2023-08-14 NOTE — H&P (View-Only) (Signed)
 Outpatient Surgical Follow Up    Jeremiah Villarreal is an 58 y.o. male.   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  He is doing well.  He is tolerating diet.  He is taking care of his own colostomy no evidence of obstruction no fevers no chills.  He did see vascular surgery and recommended EVAR's at some point in time.  He continues to smoke.  Has not seen cardiology yet.  Encouraged him to see cardiology and also to quit smoking He Did complete a recent colonoscopy that I have personally reviewed showing no evidence of malignancies.  There is normal Hartmann stump.  I have also personally reviewed his recent CT showing no acute intra-abdominal pathology.  There is some inflammatory changes that have improved.  I do not see definitive parastomal hernias or midline hernias but the fascia is very thin and I do anticipate that he will likely develop a hernia after reversal.  I was very candid with him. I also told him that smoking cessation will decrease potential complications.  Past Medical History:  Diagnosis Date   Hyperlipidemia    Hypertension     Past Surgical History:  Procedure Laterality Date   APPENDECTOMY N/A 02/06/2023   Procedure: APPENDECTOMY;  Surgeon: Leafy Ro, MD;  Location: [facility];  Service: General;  Laterality: N/A;   BLADDER REPAIR N/A 02/06/2023   Procedure: BLADDER REPAIR;  Surgeon: Leafy Ro, MD;  Location: [facility];  Service: General;  Laterality: N/A;   COLECTOMY WITH COLOSTOMY CREATION/HARTMANN PROCEDURE N/A 02/06/2023   Procedure: COLECTOMY WITH COLOSTOMY CREATION/HARTMANN PROCEDURE, WITH TAKEDOWN OF SPLENIC FLEXTURE;  Surgeon: Leafy Ro, MD;  Location: [facility];  Service: General;  Laterality: N/A;   COLONOSCOPY WITH PROPOFOL N/A 05/31/2023   Procedure: COLONOSCOPY WITH PROPOFOL;  Surgeon: Toney Reil, MD;  Location: [facility];  Service: Endoscopy;  Laterality: N/A;   POLYPECTOMY  05/31/2023   Procedure: POLYPECTOMY INTESTINAL;  Surgeon: Toney Reil, MD;  Location: [facility];  Service: Endoscopy;;   VENTRAL HERNIA REPAIR N/A 02/06/2023   Procedure: HERNIA REPAIR VENTRAL ADULT;  Surgeon: Leafy Ro, MD;  Location: [facility];  Service: General;  Laterality: N/A;    History reviewed. No pertinent family history.  Social History:  reports that he has been smoking cigarettes. He started smoking about 43 years ago. He has a 43.2 pack-year smoking history. He has been exposed to tobacco smoke. He has never used smokeless tobacco. He reports current alcohol use of about 6.0 standard drinks of alcohol per week. He reports that he does not use drugs.  Allergies: No Known Allergies  Medications reviewed.    ROS Full ROS performed and is otherwise negative other than what is stated in HPI   BP (!) 184/92   Pulse 85   Temp 98.5 F (36.9 C) (Oral)   Ht 6\' 1"  (1.854 m)   Wt 202 lb (91.6 kg)   SpO2 98%   BMI 26.65 kg/m   Physical Exam Vitals and nursing note reviewed. Exam conducted with a chaperone present.  Constitutional:      General: He is not in acute distress.    Appearance: Normal appearance. He is not toxic-appearing.  Cardiovascular:     Rate and Rhythm: Normal rate and regular rhythm.     Heart sounds: No murmur  heard. Pulmonary:     Effort: Pulmonary effort is normal. No respiratory distress.     Breath sounds: Normal breath sounds. No stridor. No wheezing or rhonchi.  Abdominal:     General: Abdomen is flat. There is no distension.     Palpations: There is no mass.     Tenderness: There is no abdominal tenderness. There is no guarding.     Comments: Colostomy in place, pink and patent.  Musculoskeletal:        General: No swelling or tenderness. Normal range of motion.     Cervical back: Normal range of motion and neck supple. No rigidity.   Skin:    General: Skin is warm and dry.     Capillary Refill: Capillary refill takes less than 2 seconds.  Neurological:     General: No focal deficit present.     Mental Status: He is alert and oriented to person, place, and time.  Psychiatric:        Mood and Affect: Mood normal.        Behavior: Behavior normal.        Thought Content: Thought content normal.        Judgment: Judgment normal.         Assessment/Plan: 58 year old male with prior history of perforated diverticulitis that required Hartman's procedure back in September last year.  He is doing well has recovered and now wants [place] to reverse his colostomy.  He continues to smoke.  He does have an upcoming appointment with cardiology but currently is not exhibiting any active cardiovascular symptoms.  I do think is reasonable to perform an open colostomy takedown.  He fully understands that this is elective.  There is significant chances of perioperative complications including wound infection as well as hernia.  He does have a weak abdominal wall with a weak fascia with smoking and arterial disease may I had risk of ventral recurrence.  He is fully understands this.  He also understands the potential risk of anastomotic leak and also the potential risk of loop ileostomy if I feel that this is the safest way to go during surgical intervention.  I have explained the procedure to him in detail and we will tentatively schedule him for a colostomy takedown in a couple weeks.  Procedure discussed with him in detail.  Risk, benefits and possible complications.  Please note that I have spent 40 minutes in this encounter including extensive review of medical records, personally reviewing imaging studies, placing orders, coordinating his care and performing documentation.  Sterling Big, MD [facility] General Surgeon

## 2023-08-14 NOTE — Progress Notes (Signed)
 Outpatient Surgical Follow Up    Jeremiah Villarreal is an 58 y.o. male.   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  He is doing well.  He is tolerating diet.  He is taking care of his own colostomy no evidence of obstruction no fevers no chills.  He did see vascular surgery and recommended EVAR's at some point in time.  He continues to smoke.  Has not seen cardiology yet.  Encouraged him to see cardiology and also to quit smoking He Did complete a recent colonoscopy that I have personally reviewed showing no evidence of malignancies.  There is normal Hartmann stump.  I have also personally reviewed his recent CT showing no acute intra-abdominal pathology.  There is some inflammatory changes that have improved.  I do not see definitive parastomal hernias or midline hernias but the fascia is very thin and I do anticipate that he will likely develop a hernia after reversal.  I was very candid with him. I also told him that smoking cessation will decrease potential complications.  Past Medical History:  Diagnosis Date   Hyperlipidemia    Hypertension     Past Surgical History:  Procedure Laterality Date   APPENDECTOMY N/A 02/06/2023   Procedure: APPENDECTOMY;  Surgeon: Leafy Ro, MD;  Location: [facility];  Service: General;  Laterality: N/A;   BLADDER REPAIR N/A 02/06/2023   Procedure: BLADDER REPAIR;  Surgeon: Leafy Ro, MD;  Location: [facility];  Service: General;  Laterality: N/A;   COLECTOMY WITH COLOSTOMY CREATION/HARTMANN PROCEDURE N/A 02/06/2023   Procedure: COLECTOMY WITH COLOSTOMY CREATION/HARTMANN PROCEDURE, WITH TAKEDOWN OF SPLENIC FLEXTURE;  Surgeon: Leafy Ro, MD;  Location: [facility];  Service: General;  Laterality: N/A;   COLONOSCOPY WITH PROPOFOL N/A 05/31/2023   Procedure: COLONOSCOPY WITH PROPOFOL;  Surgeon: Toney Reil, MD;  Location: [facility];  Service: Endoscopy;  Laterality: N/A;   POLYPECTOMY  05/31/2023   Procedure: POLYPECTOMY INTESTINAL;  Surgeon: Toney Reil, MD;  Location: [facility];  Service: Endoscopy;;   VENTRAL HERNIA REPAIR N/A 02/06/2023   Procedure: HERNIA REPAIR VENTRAL ADULT;  Surgeon: Leafy Ro, MD;  Location: [facility];  Service: General;  Laterality: N/A;    History reviewed. No pertinent family history.  Social History:  reports that he has been smoking cigarettes. He started smoking about 43 years ago. He has a 43.2 pack-year smoking history. He has been exposed to tobacco smoke. He has never used smokeless tobacco. He reports current alcohol use of about 6.0 standard drinks of alcohol per week. He reports that he does not use drugs.  Allergies: No Known Allergies  Medications reviewed.    ROS Full ROS performed and is otherwise negative other than what is stated in HPI   BP (!) 184/92   Pulse 85   Temp 98.5 F (36.9 C) (Oral)   Ht 6\' 1"  (1.854 m)   Wt 202 lb (91.6 kg)   SpO2 98%   BMI 26.65 kg/m   Physical Exam Vitals and nursing note reviewed. Exam conducted with a chaperone present.  Constitutional:      General: He is not in acute distress.    Appearance: Normal appearance. He is not toxic-appearing.  Cardiovascular:     Rate and Rhythm: Normal rate and regular rhythm.     Heart sounds: No murmur  heard. Pulmonary:     Effort: Pulmonary effort is normal. No respiratory distress.     Breath sounds: Normal breath sounds. No stridor. No wheezing or rhonchi.  Abdominal:     General: Abdomen is flat. There is no distension.     Palpations: There is no mass.     Tenderness: There is no abdominal tenderness. There is no guarding.     Comments: Colostomy in place, pink and patent.  Musculoskeletal:        General: No swelling or tenderness. Normal range of motion.     Cervical back: Normal range of motion and neck supple. No rigidity.   Skin:    General: Skin is warm and dry.     Capillary Refill: Capillary refill takes less than 2 seconds.  Neurological:     General: No focal deficit present.     Mental Status: He is alert and oriented to person, place, and time.  Psychiatric:        Mood and Affect: Mood normal.        Behavior: Behavior normal.        Thought Content: Thought content normal.        Judgment: Judgment normal.         Assessment/Plan: 58 year old male with prior history of perforated diverticulitis that required Hartman's procedure back in September last year.  He is doing well has recovered and now wants [place] to reverse his colostomy.  He continues to smoke.  He does have an upcoming appointment with cardiology but currently is not exhibiting any active cardiovascular symptoms.  I do think is reasonable to perform an open colostomy takedown.  He fully understands that this is elective.  There is significant chances of perioperative complications including wound infection as well as hernia.  He does have a weak abdominal wall with a weak fascia with smoking and arterial disease may I had risk of ventral recurrence.  He is fully understands this.  He also understands the potential risk of anastomotic leak and also the potential risk of loop ileostomy if I feel that this is the safest way to go during surgical intervention.  I have explained the procedure to him in detail and we will tentatively schedule him for a colostomy takedown in a couple weeks.  Procedure discussed with him in detail.  Risk, benefits and possible complications.  Please note that I have spent 40 minutes in this encounter including extensive review of medical records, personally reviewing imaging studies, placing orders, coordinating his care and performing documentation.  Sterling Big, MD [facility] General Surgeon

## 2023-08-16 ENCOUNTER — Ambulatory Visit: Attending: Surgery

## 2023-08-16 ENCOUNTER — Other Ambulatory Visit: Payer: Self-pay | Admitting: Surgery

## 2023-08-16 DIAGNOSIS — I7143 Infrarenal abdominal aortic aneurysm, without rupture: Secondary | ICD-10-CM | POA: Diagnosis not present

## 2023-08-17 LAB — ECHOCARDIOGRAM COMPLETE
AR max vel: 2.18 cm2
AV Area VTI: 2.07 cm2
AV Area mean vel: 2.04 cm2
AV Mean grad: 4 mmHg
AV Peak grad: 7.2 mmHg
Ao pk vel: 1.34 m/s
Area-P 1/2: 3.48 cm2
S' Lateral: 3.8 cm

## 2023-08-20 ENCOUNTER — Other Ambulatory Visit: Payer: Self-pay

## 2023-08-20 ENCOUNTER — Encounter
Admission: RE | Admit: 2023-08-20 | Discharge: 2023-08-20 | Disposition: A | Discharge status: Home / Self Care | Source: Ambulatory Visit | Attending: Surgery | Admitting: Surgery

## 2023-08-20 VITALS — Ht 72.0 in | Wt 212.0 lb

## 2023-08-20 DIAGNOSIS — I714 Abdominal aortic aneurysm, without rupture, unspecified: Secondary | ICD-10-CM | POA: Insufficient documentation

## 2023-08-20 DIAGNOSIS — I10 Essential (primary) hypertension: Secondary | ICD-10-CM | POA: Diagnosis not present

## 2023-08-20 DIAGNOSIS — I745 Embolism and thrombosis of iliac artery: Secondary | ICD-10-CM | POA: Insufficient documentation

## 2023-08-20 DIAGNOSIS — Z01818 Encounter for other preprocedural examination: Secondary | ICD-10-CM | POA: Diagnosis present

## 2023-08-20 DIAGNOSIS — I7143 Infrarenal abdominal aortic aneurysm, without rupture: Secondary | ICD-10-CM

## 2023-08-20 DIAGNOSIS — Z01810 Encounter for preprocedural cardiovascular examination: Secondary | ICD-10-CM | POA: Diagnosis not present

## 2023-08-20 HISTORY — DX: Abdominal aortic aneurysm, without rupture, unspecified: I71.40

## 2023-08-20 HISTORY — DX: Chronic obstructive pulmonary disease, unspecified: J44.9

## 2023-08-20 HISTORY — DX: Embolism and thrombosis of iliac artery: I74.5

## 2023-08-20 LAB — CBC
HCT: 47.9 % (ref 39.0–52.0)
Hemoglobin: 17.2 g/dL — ABNORMAL HIGH (ref 13.0–17.0)
MCH: 37.5 pg — ABNORMAL HIGH (ref 26.0–34.0)
MCHC: 35.9 g/dL (ref 30.0–36.0)
MCV: 104.4 fL — ABNORMAL HIGH (ref 80.0–100.0)
Platelets: 190 10*3/uL (ref 150–400)
RBC: 4.59 MIL/uL (ref 4.22–5.81)
RDW: 13 % (ref 11.5–15.5)
WBC: 8.3 10*3/uL (ref 4.0–10.5)
nRBC: 0 % (ref 0.0–0.2)

## 2023-08-20 LAB — BASIC METABOLIC PANEL WITH GFR
Anion gap: 9 (ref 5–15)
BUN: 12 mg/dL (ref 6–20)
CO2: 24 mmol/L (ref 22–32)
Calcium: 8.9 mg/dL (ref 8.9–10.3)
Chloride: 106 mmol/L (ref 98–111)
Creatinine, Ser: 0.71 mg/dL (ref 0.61–1.24)
GFR, Estimated: >60 mL/min (ref >60)
Glucose, Bld: 106 mg/dL — ABNORMAL HIGH (ref 70–99)
Potassium: 3.7 mmol/L (ref 3.5–5.1)
Sodium: 139 mmol/L (ref 135–145)

## 2023-08-20 NOTE — Patient Instructions (Signed)
 Your procedure is scheduled on: Tuesday 08/28/23 To find out your arrival time, please call [phone] between 1PM - 3PM on:   Monday 08/27/23 Report to the Registration Desk on the 1st floor of the [facility]. FREE Valet parking is available.  If your arrival time is 6:00 am, do not arrive before that time as the [facility] entrance doors do not open until 6:00 am.  REMEMBER: Instructions that are not followed completely may result in serious medical risk, up to and including death; or upon the discretion of your surgeon and anesthesiologist your surgery may need to be rescheduled.  Pre surgery diet as instructed by Dr Everlene Farrier  One week prior to surgery: Stop Anti-inflammatories (NSAIDS) such as Advil, Aleve, Ibuprofen, Motrin, Naproxen, Naprosyn and Aspirin based products such as Excedrin, Goody's Powder, BC Powder. You may however, continue to take Tylenol if needed for pain up until the day of surgery.  Stop ANY OVER THE COUNTER supplements and vitamins until after surgery.  Continue taking all prescribed medications until day of surgery.  TAKE ONLY THESE MEDICATIONS THE MORNING OF SURGERY WITH A SIP OF WATER:  amLODipine (NORVASC) 5 MG tablet  atorvastatin (LIPITOR) 20 MG tablet   Fleets enema or bowel prep as directed.  No Alcohol for 24 hours before or after surgery.  No Smoking including e-cigarettes for 24 hours before surgery.  No chewable tobacco products for at least 6 hours before surgery.  No nicotine patches on the day of surgery.  Do not use any "recreational" drugs for at least a week (preferably 2 weeks) before your surgery.  Please be advised that the combination of cocaine and anesthesia may have negative outcomes, up to and including death. If you test positive for cocaine, your surgery will be cancelled.  On the morning of surgery brush your teeth with toothpaste and water, you may rinse your mouth with mouthwash if you wish. Do not swallow any  toothpaste or mouthwash.  Use CHG Soap or wipes as directed on instruction sheet.  Do not wear lotions, powders, or perfumes.   Do not shave body hair from the neck down 48 hours before surgery.  Wear comfortable clothing (specific to your surgery type) to the hospital.  Do not wear jewelry, make-up, hairpins, clips or nail polish.  For welded (permanent) jewelry: bracelets, anklets, waist bands, etc.  Please have this removed prior to surgery.  If it is not removed, there is a chance that hospital personnel will need to cut it off on the day of surgery. Contact lenses, hearing aids and dentures may not be worn into surgery.  Do not bring valuables to the hospital. [facility] is not responsible for any missing/lost belongings or valuables.   Notify your doctor if there is any change in your medical condition (cold, fever, infection).  If you are being discharged the day of surgery, you will not be allowed to drive home. You will need a responsible individual to drive you home and stay with you for 24 hours after surgery.   If you are taking public transportation, you will need to have a responsible individual with you.  If you are being admitted to the hospital overnight, leave your suitcase in the car. After surgery it may be brought to your room.  In case of increased patient census, it may be necessary for you, the patient, to continue your postoperative care in the Same Day Surgery department.  After surgery, you can help prevent lung complications by  doing breathing exercises.  Take deep breaths and cough every 1-2 hours. Your doctor may order a device called an Incentive Spirometer to help you take deep breaths. When coughing or sneezing, hold a pillow firmly against your incision with both hands. This is called "splinting." Doing this helps protect your incision. It also decreases belly discomfort.  Surgery Visitation Policy:  Patients undergoing a surgery or procedure may  have two family members or support persons with them as long as the person is not COVID-19 positive or experiencing its symptoms.   Inpatient Visitation:    Visiting hours are 7 a.m. to 8 p.m. Up to four visitors are allowed at one time in a patient room. The visitors may rotate out with other people during the day. One designated support person (adult) may remain overnight.  Please call the Pre-admissions Testing Dept. at [phone] if you have any questions about these instructions.     Preparing for Surgery with CHLORHEXIDINE GLUCONATE (CHG) Soap  Chlorhexidine Gluconate (CHG) Soap  o An antiseptic cleaner that kills germs and bonds with the skin to continue killing germs even after washing  o Used for showering the night before surgery and morning of surgery  Before surgery, you can play an important role by reducing the number of germs on your skin.  CHG (Chlorhexidine gluconate) soap is an antiseptic cleanser which kills germs and bonds with the skin to continue killing germs even after washing.  Please do not use if you have an allergy to CHG or antibacterial soaps. If your skin becomes reddened/irritated stop using the CHG.  1. Shower the NIGHT BEFORE SURGERY and the MORNING OF SURGERY with CHG soap.  2. If you choose to wash your hair, wash your hair first as usual with your normal shampoo.  3. After shampooing, rinse your hair and body thoroughly to remove the shampoo.  4. Use CHG as you would any other liquid soap. You can apply CHG directly to the skin and wash gently with a scrungie or a clean washcloth.  5. Apply the CHG soap to your body only from the neck down. Do not use on open wounds or open sores. Avoid contact with your eyes, ears, mouth, and genitals (private parts). Wash face and genitals (private parts) with your normal soap.  6. Wash thoroughly, paying special attention to the area where your surgery will be performed.  7. Thoroughly rinse your body  with warm water.  8. Do not shower/wash with your normal soap after using and rinsing off the CHG soap.  9. Pat yourself dry with a clean towel.  10. Wear clean pajamas to bed the night before surgery.  12. Place clean sheets on your bed the night of your first shower and do not sleep with pets.  13. Shower again with the CHG soap on the day of surgery prior to arriving at the hospital.  14. Do not apply any deodorants/lotions/powders.  15. Please wear clean clothes to the hospital.

## 2023-08-26 NOTE — Progress Notes (Unsigned)
 Cardiology Office Note  Date:  08/27/2023   ID:  Jeremiah Villarreal, DOB 1965-08-02, MRN [id number]  PCP:  Cyndia Diver, PA-C   Chief Complaint  Patient presents with   New Patient (Initial Visit)    Ref by Dr. 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  Patient denies chest pain or shortness of breath.     HPI:  Jeremiah Villarreal is a 58 year old gentleman with past medical history of AAA PAD, iliac arterial disease, Essential hypertension Diverticulitis, fistula large intestine, colostomy Smoker Possible sarcoidosis on CT Emphysema Who presents by referral from Dr. Everlene Farrier for AAA  Jeremiah Villarreal presents today for preoperative evaluation for colostomy reversal surgery scheduled for tomorrow  Reports he is active, denies chest pain or shortness of breath concerning for unstable angina  Reports he is a smoker, 10 cigarettes/day  Reports he has AAA, has seen Dr. Wyn Quaker  CT scan abdomen September 2024 and February 2025 infrarenal abdominal aortic aneurysms measuring the aneurysmal sac 5.5 x 4.9 cm, the aneurysm extends into the right common iliac artery, with complete occlusion of the left common iliac artery and reconstitution of the left external iliac artery, both common femoral arteries   EKG personally reviewed by myself on todays visit EKG Interpretation Date/Time:  Monday August 27 2023 15:56:02 EDT Ventricular Rate:  70 PR Interval:  138 QRS Duration:  110 QT Interval:  394 QTC Calculation: 425 R Axis:   -31  Text Interpretation: Normal sinus rhythm Left axis deviation When compared with ECG of 20-Aug-2023 13:57, Premature ventricular complexes are no longer Present Confirmed by Julien Nordmann [phone]) on 08/27/2023 4:01:48 PM     PMH:   has a past medical history of AAA (abdominal aortic aneurysm) (HCC), COPD (chronic obstructive pulmonary disease) (HCC), Hyperlipidemia, Hypertension, and Iliac artery  occlusion, left (HCC).  PSH:    Past Surgical History:  Procedure Laterality Date   APPENDECTOMY N/A 02/06/2023   Procedure: APPENDECTOMY;  Surgeon: Leafy Ro, MD;  Location: [facility];  Service: General;  Laterality: N/A;   BLADDER REPAIR N/A 02/06/2023   Procedure: BLADDER REPAIR;  Surgeon: Leafy Ro, MD;  Location: [facility];  Service: General;  Laterality: N/A;   COLECTOMY WITH COLOSTOMY CREATION/HARTMANN PROCEDURE N/A 02/06/2023   Procedure: COLECTOMY WITH COLOSTOMY CREATION/HARTMANN PROCEDURE, WITH TAKEDOWN OF SPLENIC FLEXTURE;  Surgeon: Leafy Ro, MD;  Location: [facility];  Service: General;  Laterality: N/A;   COLONOSCOPY WITH PROPOFOL N/A 05/31/2023   Procedure: COLONOSCOPY WITH PROPOFOL;  Surgeon: Toney Reil, MD;  Location: [facility];  Service: Endoscopy;  Laterality: N/A;   POLYPECTOMY  05/31/2023   Procedure: POLYPECTOMY INTESTINAL;  Surgeon: Toney Reil, MD;  Location: [facility];  Service: Endoscopy;;   VENTRAL HERNIA REPAIR N/A 02/06/2023   Procedure: HERNIA REPAIR VENTRAL ADULT;  Surgeon: Leafy Ro, MD;  Location: [facility];  Service: General;  Laterality: N/A;    Current Outpatient Medications  Medication Sig Dispense Refill   acetaminophen (TYLENOL) 500 MG tablet Take 1,000 mg by mouth every 6 (six) hours as needed (pain.).     amLODipine (NORVASC) 5 MG tablet Take 1 tablet (5 mg total) by mouth daily. 30 tablet 2   atorvastatin (LIPITOR) 20 MG tablet Take 20 mg by mouth in the morning.     olmesartan (BENICAR) 40 MG tablet Take 40 mg by mouth in the morning.     No current facility-administered medications  for this visit.    Allergies:   Patient has no known allergies.   Social History:  The patient  reports that he has been smoking cigarettes. He started smoking about 43 years ago. He has a 43.3 pack-year smoking history. He has been exposed to tobacco smoke. He has never used smokeless tobacco. He reports current alcohol use of  about 18.0 standard drinks of alcohol per week. He reports that he does not use drugs.   Family History:   family history includes Heart attack in his father; Hypertension in his father.    Review of Systems: Review of Systems  Constitutional: Negative.   HENT: Negative.    Respiratory: Negative.    Cardiovascular: Negative.   Gastrointestinal: Negative.   Musculoskeletal: Negative.   Neurological: Negative.   Psychiatric/Behavioral: Negative.    All other systems reviewed and are negative.  PHYSICAL EXAM: VS:  BP (!) 140/90 (BP Location: Left Arm, Patient Position: Sitting, Cuff Size: Normal)   Pulse 70   Ht 6\' 1"  (1.854 m)   Wt 190 lb 8 oz (86.4 kg)   SpO2 98%   BMI 25.13 kg/m  , BMI Body mass index is 25.13 kg/m. Constitutional:  oriented to person, place, and time. No distress.  HENT:  Head: Grossly normal Eyes:  no discharge. No scleral icterus.  Neck: No JVD, no carotid bruits  Cardiovascular: Regular rate and rhythm, no murmurs appreciated Pulmonary/Chest: Clear to auscultation bilaterally, no wheezes or rails Abdominal: Soft.  no distension.  no tenderness.  Musculoskeletal: Normal range of motion Neurological:  normal muscle tone. Coordination normal. No atrophy Skin: Skin warm and dry Psychiatric: normal affect, pleasant  Recent Labs: 02/07/2023: ALT 18; Magnesium 1.8 08/20/2023: BUN 12; Creatinine, Ser 0.71; Hemoglobin 17.2; Platelets 190; Potassium 3.7; Sodium 139    Lipid Panel No results found for: "CHOL", "HDL", "LDLCALC", "TRIG"    Wt Readings from Last 3 Encounters:  08/27/23 190 lb 8 oz (86.4 kg)  08/20/23 212 lb (96.2 kg)  08/13/23 202 lb (91.6 kg)     ASSESSMENT AND PLAN:  Problem List Items Addressed This Visit       Cardiology Problems   Essential hypertension   Relevant Orders   EKG 12-Lead (Completed)   AAA (abdominal aortic aneurysm) (HCC) - Primary   Relevant Orders   EKG 12-Lead (Completed)   Iliac artery occlusion, left (HCC)    Relevant Orders   EKG 12-Lead (Completed)     Other   Acute diverticulitis   Preop cardiovascular evaluation Acceptable risk for colostomy surgery scheduled for tomorrow Reasonable exercise tolerance, denies unstable angina symptoms No further cardiac testing needed at this time Case discussed with vascular surgery and Dr. Dana Duncan  AAA Estimated 5.5 cm, previously seen by vascular surgery, Dr. Vonna Guardian Plan is for ostomy surgery and once recovered, would proceed with endograft placement for AAA  Smoker We have encouraged him to continue to work on weaning his cigarettes and smoking cessation. He will continue to work on this and does not want any assistance with chantix.    Hyperlipidemia No lipid panel available for review Currently on Lipitor 20 daily, goal LDL less than 55   Signed, Juanda Noon, M.D., Ph.D. [facility] [place], [place] [phone]

## 2023-08-27 ENCOUNTER — Encounter: Payer: Self-pay | Admitting: Cardiovascular Disease

## 2023-08-27 ENCOUNTER — Ambulatory Visit: Payer: BC Managed Care – PPO | Attending: Cardiovascular Disease | Admitting: Cardiovascular Disease

## 2023-08-27 VITALS — BP 140/90 | HR 70 | Ht 73.0 in | Wt 190.5 lb

## 2023-08-27 DIAGNOSIS — I10 Essential (primary) hypertension: Secondary | ICD-10-CM | POA: Diagnosis not present

## 2023-08-27 DIAGNOSIS — I745 Embolism and thrombosis of iliac artery: Secondary | ICD-10-CM | POA: Diagnosis not present

## 2023-08-27 DIAGNOSIS — K5792 Diverticulitis of intestine, part unspecified, without perforation or abscess without bleeding: Secondary | ICD-10-CM

## 2023-08-27 DIAGNOSIS — I7143 Infrarenal abdominal aortic aneurysm, without rupture: Secondary | ICD-10-CM | POA: Diagnosis not present

## 2023-08-27 NOTE — Patient Instructions (Signed)
 Medication Instructions:   Your Physician recommend you continue on your current medication as directed.     *If you need a refill on your cardiac medications before your next appointment, please call your pharmacy*  Lab Work: None ordered.  If you have labs (blood work) drawn today and your tests are completely normal, you will receive your results only by: MyChart Message (if you have MyChart) OR A paper copy in the mail If you have any lab test that is abnormal or we need to change your treatment, we will call you to review the results.  Testing/Procedures: None ordered.   Follow-Up: At [facility], you and your health needs are our priority.  As part of our continuing mission to provide you with exceptional heart care, our providers are all part of one team.  This team includes your primary Cardiologist (physician) and Advanced Practice Providers or APPs (Physician Assistants and Nurse Practitioners) who all work together to provide you with the care you need, when you need it.  Your next appointment:   6 month(s)  Provider:   You may see Dr. Gollan or one of the following Advanced Practice Providers on your designated Care Team:   Laneta Pintos, NP Gildardo Labrador, PA-C Varney Gentleman, PA-C Cadence [place], PA-C Ronald Cockayne, NP Morey Ar, NP    We recommend signing up for the patient portal called "MyChart".  Sign up information is provided on this After Visit Summary.  MyChart is used to connect with patients for Virtual Visits (Telemedicine).  Patients are able to view lab/test results, encounter notes, upcoming appointments, etc.  Non-urgent messages can be sent to your provider as well.   To learn more about what you can do with MyChart, go to [email or web address].

## 2023-08-27 NOTE — Progress Notes (Signed)
 Pt showed up at medical mall to be admitted for surgery. AC came to PACU and RN called MD on call. Pt needed bowel prep prior to surgery and did not do this so MD said surgery to be canceled for tomorrow to call office and potentially get it rescheduled for Thursday but the office will go over bowel prep with him tomorrow via phone. RN relayed this information to patient in the medical mall and understood to call office tomorrow.

## 2023-08-28 ENCOUNTER — Other Ambulatory Visit: Payer: Self-pay

## 2023-08-28 MED ORDER — POLYETHYLENE GLYCOL 3350 17 GM/SCOOP PO POWD: ORAL | 0 refills | Status: DC

## 2023-08-28 MED ORDER — NEOMYCIN SULFATE 500 MG PO TABS: ORAL_TABLET | ORAL | 0 refills | Status: DC

## 2023-08-28 MED ORDER — METRONIDAZOLE 500 MG PO TABS: ORAL_TABLET | ORAL | 0 refills | Status: DC

## 2023-08-28 MED ORDER — BISACODYL 5 MG PO TBEC: DELAYED_RELEASE_TABLET | ORAL | 0 refills | Status: DC

## 2023-08-29 ENCOUNTER — Encounter: Payer: Self-pay | Admitting: Surgery

## 2023-08-30 ENCOUNTER — Other Ambulatory Visit: Payer: Self-pay

## 2023-08-30 ENCOUNTER — Encounter: Payer: Self-pay | Admitting: Surgery

## 2023-08-30 ENCOUNTER — Inpatient Hospital Stay: Payer: Self-pay | Admitting: Urgent Care

## 2023-08-30 ENCOUNTER — Encounter
Admission: RE | Disposition: A | Discharge status: Home / Self Care | Payer: Self-pay | Source: Home / Self Care | Attending: Surgery

## 2023-08-30 ENCOUNTER — Inpatient Hospital Stay
Admission: RE | Admit: 2023-08-30 | Discharge: 2023-09-01 | DRG: 337 | Disposition: A | Discharge status: Home / Self Care | Attending: Surgery | Admitting: Surgery

## 2023-08-30 DIAGNOSIS — Z9889 Other specified postprocedural states: Principal | ICD-10-CM

## 2023-08-30 DIAGNOSIS — Z933 Colostomy status: Secondary | ICD-10-CM

## 2023-08-30 DIAGNOSIS — E785 Hyperlipidemia, unspecified: Secondary | ICD-10-CM | POA: Diagnosis present

## 2023-08-30 DIAGNOSIS — F17210 Nicotine dependence, cigarettes, uncomplicated: Secondary | ICD-10-CM | POA: Diagnosis present

## 2023-08-30 DIAGNOSIS — I10 Essential (primary) hypertension: Secondary | ICD-10-CM | POA: Diagnosis present

## 2023-08-30 DIAGNOSIS — E66813 Obesity, class 3: Secondary | ICD-10-CM | POA: Diagnosis present

## 2023-08-30 DIAGNOSIS — J449 Chronic obstructive pulmonary disease, unspecified: Secondary | ICD-10-CM | POA: Diagnosis present

## 2023-08-30 DIAGNOSIS — K435 Parastomal hernia without obstruction or  gangrene: Secondary | ICD-10-CM | POA: Diagnosis present

## 2023-08-30 DIAGNOSIS — Z6826 Body mass index (BMI) 26.0-26.9, adult: Secondary | ICD-10-CM

## 2023-08-30 DIAGNOSIS — I714 Abdominal aortic aneurysm, without rupture, unspecified: Secondary | ICD-10-CM | POA: Diagnosis present

## 2023-08-30 DIAGNOSIS — I739 Peripheral vascular disease, unspecified: Secondary | ICD-10-CM | POA: Diagnosis present

## 2023-08-30 DIAGNOSIS — Z433 Encounter for attention to colostomy: Secondary | ICD-10-CM | POA: Diagnosis not present

## 2023-08-30 DIAGNOSIS — K660 Peritoneal adhesions (postprocedural) (postinfection): Secondary | ICD-10-CM | POA: Diagnosis present

## 2023-08-30 HISTORY — PX: COLOSTOMY TAKEDOWN: SHX5783

## 2023-08-30 HISTORY — DX: Diverticulitis of large intestine with perforation and abscess without bleeding: K57.20

## 2023-08-30 HISTORY — DX: Thoracic aortic ectasia: I77.810

## 2023-08-30 HISTORY — DX: Fatty (change of) liver, not elsewhere classified: K76.0

## 2023-08-30 HISTORY — DX: Infrarenal abdominal aortic aneurysm, without rupture: I71.43

## 2023-08-30 SURGERY — CLOSURE, COLOSTOMY
Anesthesia: General

## 2023-08-30 MED ORDER — PHENYLEPHRINE 80 MCG/ML (10ML) SYRINGE FOR IV PUSH (FOR BLOOD PRESSURE SUPPORT)
PREFILLED_SYRINGE | INTRAVENOUS | Status: DC | PRN
  Administered 2023-08-30: 160 ug via INTRAVENOUS
  Administered 2023-08-30: 80 ug via INTRAVENOUS

## 2023-08-30 MED ORDER — ONDANSETRON HCL 4 MG/2ML IJ SOLN: 4.0000 mg | Freq: Once | INTRAMUSCULAR | Status: DC | PRN

## 2023-08-30 MED ORDER — HYDROMORPHONE HCL 1 MG/ML IJ SOLN
INTRAMUSCULAR | Status: DC | PRN
  Administered 2023-08-30: 1 mg via INTRAVENOUS

## 2023-08-30 MED ORDER — SODIUM CHLORIDE 0.9 % IV SOLN: INTRAVENOUS | Status: DC

## 2023-08-30 MED ORDER — CHLORHEXIDINE GLUCONATE CLOTH 2 % EX PADS: 6.0000 | MEDICATED_PAD | Freq: Once | CUTANEOUS | Status: DC

## 2023-08-30 MED ORDER — LACTATED RINGERS IV SOLN: INTRAVENOUS | Status: DC

## 2023-08-30 MED ORDER — OXYCODONE HCL 5 MG PO TABS: 5.0000 mg | ORAL_TABLET | ORAL | Status: DC | PRN

## 2023-08-30 MED ORDER — ALBUMIN HUMAN 5 % IV SOLN
INTRAVENOUS | Status: AC
  Filled 2023-08-30: qty 250

## 2023-08-30 MED ORDER — PREGABALIN 50 MG PO CAPS
50.0000 mg | ORAL_CAPSULE | Freq: Three times a day (TID) | ORAL | Status: DC
  Administered 2023-08-30 – 2023-09-01 (×5): 50 mg via ORAL
  Filled 2023-08-30 (×6): qty 1

## 2023-08-30 MED ORDER — HYDROMORPHONE HCL 1 MG/ML IJ SOLN
INTRAMUSCULAR | Status: AC
Start: 2023-08-30 — End: ?
  Filled 2023-08-30: qty 1

## 2023-08-30 MED ORDER — GABAPENTIN 300 MG PO CAPS
ORAL_CAPSULE | ORAL | Status: AC
  Filled 2023-08-30: qty 1

## 2023-08-30 MED ORDER — HEPARIN SODIUM (PORCINE) 5000 UNIT/ML IJ SOLN
INTRAMUSCULAR | Status: AC
  Filled 2023-08-30: qty 1

## 2023-08-30 MED ORDER — 0.9 % SODIUM CHLORIDE (POUR BTL) OPTIME
TOPICAL | Status: DC | PRN
  Administered 2023-08-30: 1000 mL

## 2023-08-30 MED ORDER — PHENYLEPHRINE HCL-NACL 20-0.9 MG/250ML-% IV SOLN
INTRAVENOUS | Status: DC | PRN
  Administered 2023-08-30: 30 ug/min via INTRAVENOUS

## 2023-08-30 MED ORDER — OXYCODONE HCL 5 MG PO TABS: 5.0000 mg | ORAL_TABLET | Freq: Once | ORAL | Status: DC | PRN

## 2023-08-30 MED ORDER — PANTOPRAZOLE SODIUM 40 MG IV SOLR
40.0000 mg | Freq: Two times a day (BID) | INTRAVENOUS | Status: DC
  Administered 2023-08-30 – 2023-09-01 (×4): 40 mg via INTRAVENOUS
  Filled 2023-08-30 (×4): qty 10

## 2023-08-30 MED ORDER — PROCHLORPERAZINE MALEATE 10 MG PO TABS: 10.0000 mg | ORAL_TABLET | Freq: Four times a day (QID) | ORAL | Status: DC | PRN

## 2023-08-30 MED ORDER — DEXAMETHASONE SODIUM PHOSPHATE 10 MG/ML IJ SOLN
INTRAMUSCULAR | Status: DC | PRN
  Administered 2023-08-30: 10 mg via INTRAVENOUS

## 2023-08-30 MED ORDER — ACETAMINOPHEN 500 MG PO TABS
1000.0000 mg | ORAL_TABLET | Freq: Four times a day (QID) | ORAL | Status: DC
  Administered 2023-08-30 – 2023-08-31 (×4): 1000 mg via ORAL
  Filled 2023-08-30 (×4): qty 2

## 2023-08-30 MED ORDER — BUPIVACAINE LIPOSOME 1.3 % IJ SUSP
INTRAMUSCULAR | Status: AC
  Filled 2023-08-30: qty 20

## 2023-08-30 MED ORDER — PROPOFOL 10 MG/ML IV BOLUS
INTRAVENOUS | Status: DC | PRN
  Administered 2023-08-30: 50 mg via INTRAVENOUS
  Administered 2023-08-30: 150 mg via INTRAVENOUS

## 2023-08-30 MED ORDER — ALVIMOPAN 12 MG PO CAPS
12.0000 mg | ORAL_CAPSULE | ORAL | Status: DC
Start: 2023-08-31 — End: 2023-08-30
  Filled 2023-08-30: qty 1

## 2023-08-30 MED ORDER — SUCCINYLCHOLINE CHLORIDE 200 MG/10ML IV SOSY
PREFILLED_SYRINGE | INTRAVENOUS | Status: DC | PRN
  Administered 2023-08-30: 100 mg via INTRAVENOUS

## 2023-08-30 MED ORDER — MIDAZOLAM HCL 2 MG/2ML IJ SOLN
INTRAMUSCULAR | Status: DC | PRN
  Administered 2023-08-30: 2 mg via INTRAVENOUS

## 2023-08-30 MED ORDER — SODIUM CHLORIDE 0.9 % IV SOLN: INTRAVENOUS | Status: DC | PRN

## 2023-08-30 MED ORDER — ROCURONIUM BROMIDE 100 MG/10ML IV SOLN
INTRAVENOUS | Status: DC | PRN
Start: 2023-08-30 — End: 2023-08-30
  Administered 2023-08-30 (×2): 50 mg via INTRAVENOUS
  Administered 2023-08-30: 30 mg via INTRAVENOUS

## 2023-08-30 MED ORDER — IRRISEPT - 450ML BOTTLE WITH 0.05% CHG IN STERILE WATER, USP 99.95% OPTIME
TOPICAL | Status: DC | PRN
  Administered 2023-08-30: 450 mL

## 2023-08-30 MED ORDER — DIPHENHYDRAMINE HCL 12.5 MG/5ML PO ELIX: 12.5000 mg | ORAL_SOLUTION | Freq: Four times a day (QID) | ORAL | Status: DC | PRN

## 2023-08-30 MED ORDER — SODIUM CHLORIDE 0.9 % IV SOLN
2.0000 g | Freq: Two times a day (BID) | INTRAVENOUS | Status: AC
  Administered 2023-08-30 – 2023-08-31 (×3): 2 g via INTRAVENOUS
  Filled 2023-08-30 (×4): qty 2

## 2023-08-30 MED ORDER — MORPHINE SULFATE (PF) 2 MG/ML IV SOLN
2.0000 mg | INTRAVENOUS | Status: DC | PRN
  Administered 2023-08-30: 2 mg via INTRAVENOUS
  Filled 2023-08-30: qty 1

## 2023-08-30 MED ORDER — BUPIVACAINE-EPINEPHRINE (PF) 0.25% -1:200000 IJ SOLN
INTRAMUSCULAR | Status: DC | PRN
  Administered 2023-08-30: 50 mL via INTRAMUSCULAR

## 2023-08-30 MED ORDER — ONDANSETRON HCL 4 MG/2ML IJ SOLN
INTRAMUSCULAR | Status: DC | PRN
  Administered 2023-08-30 (×3): 4 mg via INTRAVENOUS

## 2023-08-30 MED ORDER — CELECOXIB 200 MG PO CAPS
200.0000 mg | ORAL_CAPSULE | ORAL | Status: AC
Start: 2023-08-30 — End: 2023-08-30
  Administered 2023-08-30: 200 mg via ORAL

## 2023-08-30 MED ORDER — CHLORHEXIDINE GLUCONATE CLOTH 2 % EX PADS
6.0000 | MEDICATED_PAD | Freq: Once | CUTANEOUS | Status: AC
  Administered 2023-08-30: 6 via TOPICAL

## 2023-08-30 MED ORDER — ALVIMOPAN 12 MG PO CAPS
ORAL_CAPSULE | ORAL | Status: AC
  Filled 2023-08-30: qty 1

## 2023-08-30 MED ORDER — PROCHLORPERAZINE EDISYLATE 10 MG/2ML IJ SOLN: 5.0000 mg | Freq: Four times a day (QID) | INTRAMUSCULAR | Status: DC | PRN

## 2023-08-30 MED ORDER — ACETAMINOPHEN 500 MG PO TABS
ORAL_TABLET | ORAL | Status: AC
  Filled 2023-08-30: qty 2

## 2023-08-30 MED ORDER — ACETAMINOPHEN 500 MG PO TABS
1000.0000 mg | ORAL_TABLET | ORAL | Status: AC
  Administered 2023-08-30: 1000 mg via ORAL

## 2023-08-30 MED ORDER — FENTANYL CITRATE (PF) 100 MCG/2ML IJ SOLN
INTRAMUSCULAR | Status: AC
  Filled 2023-08-30: qty 2

## 2023-08-30 MED ORDER — MELATONIN 5 MG PO TABS: 5.0000 mg | ORAL_TABLET | Freq: Every evening | ORAL | Status: DC | PRN

## 2023-08-30 MED ORDER — HYDRALAZINE HCL 20 MG/ML IJ SOLN: 10.0000 mg | INTRAMUSCULAR | Status: DC | PRN

## 2023-08-30 MED ORDER — PROPOFOL 1000 MG/100ML IV EMUL
INTRAVENOUS | Status: AC
  Filled 2023-08-30: qty 100

## 2023-08-30 MED ORDER — FENTANYL CITRATE (PF) 100 MCG/2ML IJ SOLN: 25.0000 ug | INTRAMUSCULAR | Status: DC | PRN

## 2023-08-30 MED ORDER — ENOXAPARIN SODIUM 40 MG/0.4ML IJ SOSY
40.0000 mg | PREFILLED_SYRINGE | INTRAMUSCULAR | Status: DC
  Administered 2023-08-31 – 2023-09-01 (×2): 40 mg via SUBCUTANEOUS
  Filled 2023-08-30 (×2): qty 0.4

## 2023-08-30 MED ORDER — VASOPRESSIN 20 UNIT/ML IV SOLN
INTRAVENOUS | Status: DC | PRN
Start: 2023-08-30 — End: 2023-08-30
  Administered 2023-08-30: 4 [IU] via INTRAVENOUS
  Administered 2023-08-30 (×3): 2 [IU] via INTRAVENOUS

## 2023-08-30 MED ORDER — KETOROLAC TROMETHAMINE 30 MG/ML IJ SOLN
30.0000 mg | Freq: Four times a day (QID) | INTRAMUSCULAR | Status: DC
  Administered 2023-08-30 – 2023-08-31 (×4): 30 mg via INTRAVENOUS
  Filled 2023-08-30 (×4): qty 1

## 2023-08-30 MED ORDER — BUPIVACAINE-EPINEPHRINE (PF) 0.25% -1:200000 IJ SOLN
INTRAMUSCULAR | Status: AC
  Filled 2023-08-30: qty 30

## 2023-08-30 MED ORDER — METHOCARBAMOL 1000 MG/10ML IJ SOLN: 500.0000 mg | Freq: Three times a day (TID) | INTRAMUSCULAR | Status: DC | PRN

## 2023-08-30 MED ORDER — CEFOTETAN DISODIUM 2 G IJ SOLR
2.0000 g | INTRAMUSCULAR | Status: AC
  Administered 2023-08-30: 2 g via INTRAVENOUS

## 2023-08-30 MED ORDER — GABAPENTIN 300 MG PO CAPS
300.0000 mg | ORAL_CAPSULE | ORAL | Status: AC
Start: 2023-08-30 — End: 2023-08-30
  Administered 2023-08-30: 300 mg via ORAL

## 2023-08-30 MED ORDER — AMLODIPINE BESYLATE 5 MG PO TABS
5.0000 mg | ORAL_TABLET | Freq: Every day | ORAL | Status: DC
  Administered 2023-08-31 – 2023-09-01 (×2): 5 mg via ORAL
  Filled 2023-08-30 (×2): qty 1

## 2023-08-30 MED ORDER — HEPARIN SODIUM (PORCINE) 5000 UNIT/ML IJ SOLN
5000.0000 [IU] | Freq: Once | INTRAMUSCULAR | Status: AC
Start: 2023-08-30 — End: 2023-08-30
  Administered 2023-08-30: 5000 [IU] via SUBCUTANEOUS

## 2023-08-30 MED ORDER — FENTANYL CITRATE (PF) 100 MCG/2ML IJ SOLN
INTRAMUSCULAR | Status: DC | PRN
  Administered 2023-08-30 (×2): 50 ug via INTRAVENOUS

## 2023-08-30 MED ORDER — DEXMEDETOMIDINE HCL IN NACL 200 MCG/50ML IV SOLN
INTRAVENOUS | Status: DC | PRN
  Administered 2023-08-30: 8 ug via INTRAVENOUS
  Administered 2023-08-30: 12 ug via INTRAVENOUS

## 2023-08-30 MED ORDER — HYDROMORPHONE HCL 1 MG/ML IJ SOLN
INTRAMUSCULAR | Status: AC
  Filled 2023-08-30: qty 1

## 2023-08-30 MED ORDER — DIPHENHYDRAMINE HCL 50 MG/ML IJ SOLN: 12.5000 mg | Freq: Four times a day (QID) | INTRAMUSCULAR | Status: DC | PRN

## 2023-08-30 MED ORDER — ALVIMOPAN 12 MG PO CAPS
12.0000 mg | ORAL_CAPSULE | ORAL | Status: AC
  Administered 2023-08-30: 12 mg via ORAL

## 2023-08-30 MED ORDER — MIDAZOLAM HCL 2 MG/2ML IJ SOLN
INTRAMUSCULAR | Status: AC
  Filled 2023-08-30: qty 2

## 2023-08-30 MED ORDER — ALBUMIN HUMAN 5 % IV SOLN
INTRAVENOUS | Status: DC | PRN
Start: 2023-08-30 — End: 2023-08-30

## 2023-08-30 MED ORDER — GLYCOPYRROLATE 0.2 MG/ML IJ SOLN
INTRAMUSCULAR | Status: DC | PRN
  Administered 2023-08-30: .2 mg via INTRAVENOUS

## 2023-08-30 MED ORDER — CHLORHEXIDINE GLUCONATE 0.12 % MT SOLN
15.0000 mL | Freq: Once | OROMUCOSAL | Status: AC
  Administered 2023-08-30: 15 mL via OROMUCOSAL

## 2023-08-30 MED ORDER — CELECOXIB 200 MG PO CAPS
ORAL_CAPSULE | ORAL | Status: AC
  Filled 2023-08-30: qty 1

## 2023-08-30 MED ORDER — OXYCODONE HCL 5 MG/5ML PO SOLN: 5.0000 mg | Freq: Once | ORAL | Status: DC | PRN

## 2023-08-30 MED ORDER — ORAL CARE MOUTH RINSE: 15.0000 mL | Freq: Once | OROMUCOSAL | Status: AC

## 2023-08-30 MED ORDER — CHLORHEXIDINE GLUCONATE 0.12 % MT SOLN
OROMUCOSAL | Status: AC
  Filled 2023-08-30: qty 15

## 2023-08-30 MED ORDER — METHOCARBAMOL 500 MG PO TABS: 500.0000 mg | ORAL_TABLET | Freq: Three times a day (TID) | ORAL | Status: DC | PRN

## 2023-08-30 MED ORDER — SUGAMMADEX SODIUM 200 MG/2ML IV SOLN
INTRAVENOUS | Status: DC | PRN
  Administered 2023-08-30: 300 mg via INTRAVENOUS

## 2023-08-30 MED ORDER — LIDOCAINE HCL (CARDIAC) PF 100 MG/5ML IV SOSY
PREFILLED_SYRINGE | INTRAVENOUS | Status: DC | PRN
  Administered 2023-08-30: 100 mg via INTRAVENOUS

## 2023-08-30 MED ORDER — SODIUM CHLORIDE 0.9 % IV SOLN
INTRAVENOUS | Status: AC
  Filled 2023-08-30: qty 2

## 2023-08-30 SURGICAL SUPPLY — 75 items
APPLIER CLIP 11 MED OPEN (CLIP) IMPLANT
APPLIER CLIP 13 LRG OPEN (CLIP) IMPLANT
BARRIER ADH SEPRAFILM 3INX5IN (MISCELLANEOUS) IMPLANT
BLADE CLIPPER SURG (BLADE) ×1 IMPLANT
CATH URET ROBINSON 16FR STRL (CATHETERS) ×1 IMPLANT
CHLORAPREP W/TINT 26 (MISCELLANEOUS) ×1 IMPLANT
CLIP APPLIE 11 MED OPEN (CLIP) IMPLANT
CLIP APPLIE 13 LRG OPEN (CLIP) IMPLANT
DEFOGGER SCOPE WARMER CLEARIFY (MISCELLANEOUS) IMPLANT
DRAIN CHANNEL JP 19F RND 3/16 (MISCELLANEOUS) IMPLANT
DRAPE INCISE IOBAN 66X45 STRL (DRAPES) IMPLANT
DRAPE LAPAROTOMY 100X77 ABD (DRAPES) ×1 IMPLANT
DRAPE LEGGINS SURG 28X43 STRL (DRAPES) IMPLANT
DRAPE UNDER BUTTOCK W/FLU (DRAPES) IMPLANT
DRSG OPSITE POSTOP 3X4 (GAUZE/BANDAGES/DRESSINGS) IMPLANT
DRSG OPSITE POSTOP 4X6 (GAUZE/BANDAGES/DRESSINGS) ×1 IMPLANT
DRSG TELFA 3X4 N-ADH STERILE (GAUZE/BANDAGES/DRESSINGS) ×1 IMPLANT
DRSG TELFA 3X8 NADH STRL (GAUZE/BANDAGES/DRESSINGS) ×1 IMPLANT
ELECT BLADE 6.5 EXT (BLADE) ×1 IMPLANT
ELECT CAUTERY BLADE 6.4 (BLADE) ×1 IMPLANT
ELECT REM PT RETURN 9FT ADLT (ELECTROSURGICAL) ×1 IMPLANT
ELECTRODE REM PT RTRN 9FT ADLT (ELECTROSURGICAL) ×1 IMPLANT
EVACUATOR SILICONE 100CC (DRAIN) IMPLANT
GAUZE 4X4 16PLY ~~LOC~~+RFID DBL (SPONGE) ×1 IMPLANT
GAUZE SPONGE 4X4 12PLY STRL (GAUZE/BANDAGES/DRESSINGS) ×1 IMPLANT
GLOVE BIO SURGEON STRL SZ7 (GLOVE) ×4 IMPLANT
GOWN STRL REUS W/ TWL LRG LVL3 (GOWN DISPOSABLE) ×6 IMPLANT
HANDLE SUCTION POOLE (INSTRUMENTS) ×1 IMPLANT
HANDLE YANKAUER SUCT BULB TIP (MISCELLANEOUS) IMPLANT
IRRIGATION STRYKERFLOW (MISCELLANEOUS) IMPLANT
IRRIGATOR STRYKERFLOW (MISCELLANEOUS) ×1 IMPLANT
KIT PINK PAD W/HEAD ARE REST (MISCELLANEOUS) ×1 IMPLANT
KIT PINK PAD W/HEAD ARM REST (MISCELLANEOUS) ×1 IMPLANT
KITTNER LAPARASCOPIC 5X40 (MISCELLANEOUS) IMPLANT
L-HOOK LAP DISP 36CM (ELECTROSURGICAL) ×1 IMPLANT
LHOOK LAP DISP 36CM (ELECTROSURGICAL) IMPLANT
LIGASURE IMPACT 36 18CM CVD LR (INSTRUMENTS) IMPLANT
MANIFOLD NEPTUNE II (INSTRUMENTS) ×1 IMPLANT
NDL HYPO 22X1.5 SAFETY MO (MISCELLANEOUS) ×1 IMPLANT
NEEDLE HYPO 22X1.5 SAFETY MO (MISCELLANEOUS) ×1 IMPLANT
NS IRRIG 1000ML POUR BTL (IV SOLUTION) ×1 IMPLANT
PACK BASIN MAJOR ARMC (MISCELLANEOUS) ×1 IMPLANT
PACK COLON CLEAN CLOSURE (MISCELLANEOUS) ×1 IMPLANT
RELOAD PROXIMATE 75MM BLUE (ENDOMECHANICALS) IMPLANT
RELOAD STAPLE 75 3.8 BLU REG (ENDOMECHANICALS) IMPLANT
SET TUBE SMOKE EVAC HIGH FLOW (TUBING) IMPLANT
SLEEVE Z-THREAD 5X100MM (TROCAR) IMPLANT
SOL PREP PVP 2OZ (MISCELLANEOUS) ×1 IMPLANT
SOLUTION PREP PVP 2OZ (MISCELLANEOUS) ×1 IMPLANT
SPONGE DRAIN TRACH 4X4 STRL 2S (GAUZE/BANDAGES/DRESSINGS) IMPLANT
SPONGE T-LAP 18X18 ~~LOC~~+RFID (SPONGE) ×4 IMPLANT
SPONGE T-LAP 4X18 ~~LOC~~+RFID (SPONGE) IMPLANT
STAPLER PROXIMATE 75MM BLUE (STAPLE) IMPLANT
STAPLER SKIN PROX 35W (STAPLE) ×1 IMPLANT
SUCTION POOLE HANDLE (INSTRUMENTS) IMPLANT
SURGILUBE 2OZ TUBE FLIPTOP (MISCELLANEOUS) ×1 IMPLANT
SUT ETHIBOND 0 MO6 C/R (SUTURE) IMPLANT
SUT ETHILON 3-0 FS-10 30 BLK (SUTURE) ×1 IMPLANT
SUT PDS AB 0 CT1 27 (SUTURE) ×4 IMPLANT
SUT SILK 2 0SH CR/8 30 (SUTURE) ×1 IMPLANT
SUT SILK 2-0 18XBRD TIE 12 (SUTURE) ×1 IMPLANT
SUT VIC AB 2-0 SH 27XBRD (SUTURE) IMPLANT
SUT VIC AB 3-0 SH 27X BRD (SUTURE) IMPLANT
SUTURE EHLN 3-0 FS-10 30 BLK (SUTURE) IMPLANT
SWAB CULTURE ESWAB REG 1ML (MISCELLANEOUS) IMPLANT
SYR 20ML LL LF (SYRINGE) ×2 IMPLANT
SYR TOOMEY 50ML (SYRINGE) ×1 IMPLANT
SYS LAPSCP GELPORT 120MM (MISCELLANEOUS) ×1 IMPLANT
SYS TROCAR 1.5-3 SLV ABD GEL (ENDOMECHANICALS) ×1 IMPLANT
SYSTEM LAPSCP GELPORT 120MM (MISCELLANEOUS) IMPLANT
SYSTEM TROCR 1.5-3 SLV ABD GEL (ENDOMECHANICALS) IMPLANT
TRAP FLUID SMOKE EVACUATOR (MISCELLANEOUS) ×1 IMPLANT
TRAY FOLEY MTR SLVR 16FR STAT (SET/KITS/TRAYS/PACK) ×1 IMPLANT
TROCAR Z-THREAD FIOS 5X100MM (TROCAR) IMPLANT
WATER STERILE IRR 500ML POUR (IV SOLUTION) ×1 IMPLANT

## 2023-08-30 NOTE — Transfer of Care (Signed)
 Immediate Anesthesia Transfer of Care Note  Patient: Jeremiah Villarreal  Procedure(s) Performed: CLOSURE, COLOSTOMY  Patient Location: PACU  Anesthesia Type:General  Level of Consciousness: awake, drowsy, and patient cooperative  Airway & Oxygen Therapy: Patient Spontanous Breathing and Patient connected to face mask oxygen  Post-op Assessment: Report given to RN and Post -op Vital signs reviewed and stable  Post vital signs: Reviewed and stable  Last Vitals:  Vitals Value Taken Time  BP 128/77 08/30/23 1130  Temp    Pulse 74 08/30/23 1131  Resp 8 08/30/23 1131  SpO2 98 % 08/30/23 1131  Vitals shown include unfiled device data.  Last Pain:  Vitals:   08/30/23 0641  TempSrc: Temporal  PainSc: 0-No pain         Complications: No notable events documented.

## 2023-08-30 NOTE — Anesthesia Procedure Notes (Signed)
 Procedure Name: Intubation Date/Time: 08/30/2023 7:40 AM  Performed by: Niki Barter, CRNAPre-anesthesia Checklist: Patient identified, Emergency Drugs available, Suction available and Patient being monitored Patient Re-evaluated:Patient Re-evaluated prior to induction Oxygen Delivery Method: Circle system utilized Preoxygenation: Pre-oxygenation with 100% oxygen Induction Type: IV induction Ventilation: Mask ventilation without difficulty Laryngoscope Size: McGrath and 3 Grade View: Grade I Tube type: Oral Number of attempts: 1 Airway Equipment and Method: Stylet Placement Confirmation: ETT inserted through vocal cords under direct vision, positive ETCO2 and breath sounds checked- equal and bilateral Secured at: 21 cm Tube secured with: Tape Dental Injury: Teeth and Oropharynx as per pre-operative assessment

## 2023-08-30 NOTE — Interval H&P Note (Signed)
 History and Physical Interval Note:  08/30/2023 7:19 AM  Jeremiah Villarreal  has presented today for surgery, with the diagnosis of Colostomy in place.  The various methods of treatment have been discussed with the patient and family. After consideration of risks, benefits and other options for treatment, the patient has consented to  Procedure(s) with comments: CLOSURE, COLOSTOMY (N/A) - Apolonio Bay, PA-C will be assisting as a surgical intervention.  The patient's history has been reviewed, patient examined, no change in status, stable for surgery.  I have reviewed the patient's chart and labs.  Questions were answered to the patient's satisfaction.     Shadrick Senne F Toma Arts

## 2023-08-30 NOTE — Anesthesia Preprocedure Evaluation (Signed)
 Anesthesia Evaluation  Patient identified by MRN, date of birth, ID band Patient awake    Reviewed: Allergy & Precautions, NPO status , Patient's Chart, lab work & pertinent test results  Airway Mallampati: II  TM Distance: >3 FB Neck ROM: Full    Dental  (+) Teeth Intact   Pulmonary COPD,  COPD inhaler, Current Smoker and Patient abstained from smoking.    + decreased breath sounds      Cardiovascular Exercise Tolerance: Good hypertension, Pt. on medications + Peripheral Vascular Disease   Rhythm:Regular Rate:Normal  AAA: 38 mm   Neuro/Psych negative neurological ROS  negative psych ROS   GI/Hepatic negative GI ROS, Neg liver ROS,,,  Endo/Other    Class 3 obesity  Renal/GU negative Renal ROS  negative genitourinary   Musculoskeletal   Abdominal  (+) + obese  Peds negative pediatric ROS (+)  Hematology negative hematology ROS (+)   Anesthesia Other Findings   Reproductive/Obstetrics                             Anesthesia Physical Anesthesia Plan  ASA: 3  Anesthesia Plan: General   Post-op Pain Management:    Induction:   PONV Risk Score and Plan:   Airway Management Planned: Oral ETT  Additional Equipment:   Intra-op Plan:   Post-operative Plan: Extubation in OR  Informed Consent: I have reviewed the patients History and Physical, chart, labs and discussed the procedure including the risks, benefits and alternatives for the proposed anesthesia with the patient or authorized representative who has indicated his/her understanding and acceptance.       Plan Discussed with: CRNA  Anesthesia Plan Comments:        Anesthesia Quick Evaluation

## 2023-08-30 NOTE — Op Note (Signed)
 PROCEDURES: Laparoscopic takedown of splenic flexure Hand assisted laparoscopic takedown of Hartmann's with end to end EEA 25 mm Anastomosis Repair parastomal hernia 4 cms  Pre-operative Diagnosis:Colostomy in place , hx perf diverticulitis , parastomal hernia   Post-operative Diagnosis: same   Surgeon: Marcial Setting [facility] Neuro[facility]   Assistants: Gabriela Johns [facility]  Anesthesia: General endotracheal anesthesia  ASA Class: 3   Surgeon: Evelia Hipp , MD FACS  Anesthesia: Gen. with endotracheal tube   Findings: Parastomal hernia 4 cms Dense adhesion from the omentum to the descending colon, small bowel to abdominal wall and colon to the abdominal wall I spent at lest 90 minutes of total operative time performing enterolysis and restoring the anatomy, this added significant time to the operation substantially greater than typically required Tension free anastomosis with excellent perfusion and widely patent w/o evidence of intraoperative leak. Omental Phlegmon /abscess drained and cultured  Estimated Blood Loss: 100cc         Drains: 19 blake palvis         Specimens: colostomy          Complications: none         Condition: stable  Procedure Details  The patient was seen again in the Holding Room. The benefits, complications, treatment options, and expected outcomes were discussed with the patient. The risks of bleeding, infection, recurrence of symptoms, failure to resolve symptoms,  anastomotic leak,bowel injury, any of which could require further surgery were reviewed with the patient.   The patient was taken to Operating Room, identified as Jeremiah Villarreal and the procedure verified.  A Time Out was held and the above information confirmed.   Prior to the induction of general anesthesia, antibiotic prophylaxis was administered. VTE prophylaxis was in place. General endotracheal anesthesia was then administered and tolerated well. After the induction, I placed a 2-0 silk pursestring suture to  close to colostomy. The abdomen was prepped with Chloraprep and draped in the sterile fashion. The patient was positioned in lithotomy position.     Elliptical incision was created around the stoma, cautery used to dissect the stoma from the abdominal wall. We encountered a medium size parastomal hernia and the hernia sac was opened and the bowel dissected free from the abdominal wall. The abdominal cavity was entered under direct visualization. I started open lysis of adhesions , those were dense and time consuming. I used metz and cautery were safe.  Given the decent size of the abdominal wall defect I thought we could try a hand assisted laparoscopic approach. THe gelport was placed and pneumoperitoneum established w/o HD changes.  two 5 mm port was placed  under direct visualization. There were dense adhesions from the omentum to the abdominal wall that were lysed in the standard fashion with finger  fracturing and the cutting setting with laparoscopic hook.  There was significant adhesive disease in the pelvis from the sigmoid to the pelvic wall and also from the sigmoid to the small bowel. This adhesions were lysed with a combination of finger fracturing and cutting setting with the hook . The white line of pot was identified and divided and we mobilized the descending colon IN a lateral to medial fashion. We preserved the ureter at all times. We were also able to mobilize the splenic flexure using electrocautery in the standard fashion. We did spent significant time reestablishing the anatomy, it was a lengthy and complex case.   There was a phlegmon and chronic abscess within the greater omentum, some pus was  drained and cultured. The descending colon stump was exteriorized. We opened the stopped and measure the diameter of the bowel. A 29 mm dilator was perfect size. A pursestring was used after inserting the anvil device. Mr Nowell Begun [facility] was able to pass a 25 mm standard EEA stapler device  through the anus  through the end of the rectal stump. Under direct visualization we performed an end to end anastomosis with the EEA device. A leak test was performed inflating the colon with a Toomey syringe and a rubber catheter. No evidence of leak was observed. There was also adequate hemostasis. A 19 Blake drain was placed in the pelvis.  The drain was sutured in place with a 3-0 nylon. All the laparoscopic ports were removed and a second look showed no evidence of any bleeding or any other injuries.   We changed gloves and place a new tray to close the   abdomen.  Liposomal marcaine was injected as bilateral TAP blocks. THe parastomal hernia defect was closed using multiple 0 ethibond sutures  paying attention to close the anterior and the posterior fascia. I was happy with the tension free repair but given his multiple operation and risks factors I am not surprise that this repair will recurr eventually. Pt is fully aware of this. Sub q closed w 3-0 vicryl and skin incision closed loosely with staples.  Needle and laparotomy count were correct and there were no immediate occasions Note that Mr. Nowell Begun [facility] was essential in assisting throughout the surgery, he exposed and helped performed the EEA anastomosis.   Evelia Hipp, MD, FACS

## 2023-08-31 ENCOUNTER — Encounter: Payer: Self-pay | Admitting: Surgery

## 2023-08-31 LAB — BASIC METABOLIC PANEL WITH GFR
Anion gap: 8 (ref 5–15)
BUN: 12 mg/dL (ref 6–20)
CO2: 22 mmol/L (ref 22–32)
Calcium: 8.3 mg/dL — ABNORMAL LOW (ref 8.9–10.3)
Chloride: 108 mmol/L (ref 98–111)
Creatinine, Ser: 0.72 mg/dL (ref 0.61–1.24)
GFR, Estimated: >60 mL/min (ref >60)
Glucose, Bld: 111 mg/dL — ABNORMAL HIGH (ref 70–99)
Potassium: 3.9 mmol/L (ref 3.5–5.1)
Sodium: 138 mmol/L (ref 135–145)

## 2023-08-31 LAB — CBC
HCT: 42.3 % (ref 39.0–52.0)
Hemoglobin: 15.1 g/dL (ref 13.0–17.0)
MCH: 38.5 pg — ABNORMAL HIGH (ref 26.0–34.0)
MCHC: 35.7 g/dL (ref 30.0–36.0)
MCV: 107.9 fL — ABNORMAL HIGH (ref 80.0–100.0)
Platelets: 179 10*3/uL (ref 150–400)
RBC: 3.92 MIL/uL — ABNORMAL LOW (ref 4.22–5.81)
RDW: 12.3 % (ref 11.5–15.5)
WBC: 11.8 10*3/uL — ABNORMAL HIGH (ref 4.0–10.5)
nRBC: 0 % (ref 0.0–0.2)

## 2023-08-31 LAB — SURGICAL PATHOLOGY

## 2023-08-31 LAB — MAGNESIUM: Magnesium: 1.9 mg/dL (ref 1.7–2.4)

## 2023-08-31 LAB — PHOSPHORUS: Phosphorus: 3 mg/dL (ref 2.5–4.6)

## 2023-08-31 MED ORDER — IRBESARTAN 150 MG PO TABS
150.0000 mg | ORAL_TABLET | Freq: Every day | ORAL | Status: DC
  Administered 2023-08-31 – 2023-09-01 (×2): 150 mg via ORAL
  Filled 2023-08-31 (×2): qty 1

## 2023-08-31 NOTE — Plan of Care (Signed)

## 2023-08-31 NOTE — Progress Notes (Signed)
 POD # 1  s/p HALS HArtmann's takedown Doing very well Minimal pain, + Flatus AVSS LAbs ok Tolerating liquids   PE NAD Abd: soft, incision c/d/I, dressing in place no infection. JP serosanguinous . No peritonitis   A/P DOing very well Soft diet for dinner AMbulate DC foley Heplock ivf Dc in am Added alternative sartan ( BP med) that we have on formulary but at 1/2 his home dose

## 2023-08-31 NOTE — Anesthesia Postprocedure Evaluation (Signed)
 Anesthesia Post Note  Patient: Jeremiah Villarreal  Procedure(s) Performed: CLOSURE, COLOSTOMY  Patient location during evaluation: PACU Anesthesia Type: General Level of consciousness: awake Pain management: satisfactory to patient Vital Signs Assessment: post-procedure vital signs reviewed and stable Respiratory status: spontaneous breathing and nonlabored ventilation Cardiovascular status: stable Anesthetic complications: no   No notable events documented.   Last Vitals:  Vitals:   08/31/23 0425 08/31/23 0445  BP: (!) 163/101 (!) 165/93  Pulse: (!) 58 62  Resp: 16   Temp: 36.6 C   SpO2: 99%     Last Pain:  Vitals:   08/31/23 0510  TempSrc:   PainSc: 4                  VAN STAVEREN,Davinia Riccardi

## 2023-09-01 MED ORDER — IBUPROFEN 600 MG PO TABS: 600.0000 mg | ORAL_TABLET | Freq: Three times a day (TID) | ORAL | 1 refills | Status: AC | PRN

## 2023-09-01 MED ORDER — OXYCODONE HCL 5 MG PO TABS: 5.0000 mg | ORAL_TABLET | ORAL | 0 refills | Status: DC | PRN

## 2023-09-01 MED ORDER — METHOCARBAMOL 500 MG PO TABS: 500.0000 mg | ORAL_TABLET | Freq: Three times a day (TID) | ORAL | 1 refills | Status: AC | PRN

## 2023-09-01 NOTE — Discharge Instructions (Signed)
 Discharge Instructions: 1.  Patient may shower, but do not scrub wounds heavily and dab dry only. 2.  Do not submerge wounds in pool/tub until fully healed. 3.  Do not apply ointments or hydrogen peroxide to the wounds. 4.  Do not remove staples. 5.  Prior drain site:  continue dry gauze dressing changes once daily and as needed to keep the area clean/dry, until the wound is healed/closed.   6.  Staple incisions:  continue dry gauze dressing changes once daily and as needed to keep the area clean/dry. 7.  Continue a soft diet for the next 3 days.  Then can start a low fiber diet for the next week weeks.  Then can advance to a high fiber diet. 8.  Do not drive while taking narcotics for pain control.  Prior to driving, make sure you are able to rotate right and left to look at blindspots without significant pain or discomfort. 9.  No heavy lifting or pushing of more than 10-15 lbs for 4 weeks.

## 2023-09-01 NOTE — Plan of Care (Signed)

## 2023-09-01 NOTE — Discharge Summary (Signed)
 Patient ID: Jeremiah Villarreal MRN: [id number] DOB/AGE: [birth year] 58 y.o.  Admit date: 08/30/2023 Discharge date: 09/01/2023   Discharge Diagnoses:  Principal Problem:   S/P colostomy takedown Active Problems:   Colostomy in place [facility])   Procedures:  Laparoscopic hand assisted colostomy reversal and splenic flexure mobilization.  Hospital Course:  Patient was admitted on 08/30/23 after undergoing the above surgery.  He tolerated the procedure well.  Post-operatively, he has been doing well.  He is having flatus and denies any nausea or distention.  His diet was advanced appropriately.  His drain had low serosanguinous output.  Pain was well controlled and he was ambulating.    On exam, his abdomen is soft, non-distended, appropriately sore.  Incisions clean, dry, intact with staples.  Drain with serosanguinous fluid, removed at bedside.  OK to discharge home.  Consults: None  Disposition: Discharge disposition: 01-Home or Self Care       Discharge Instructions     Call MD for:  difficulty breathing, headache or visual disturbances   Complete by: As directed    Call MD for:  persistant nausea and vomiting   Complete by: As directed    Call MD for:  redness, tenderness, or signs of infection (pain, swelling, redness, odor or green/yellow discharge around incision site)   Complete by: As directed    Call MD for:  severe uncontrolled pain   Complete by: As directed    Call MD for:  temperature >100.4   Complete by: As directed    Diet general   Complete by: As directed    Continue a soft diet for the next 3 days.  Then can start a low fiber diet for the next two weeks.  Then can advance to a high fiber diet.   Discharge instructions   Complete by: As directed    1.  Patient may shower, but do not scrub wounds heavily and dab dry only. 2.  Do not submerge wounds in pool/tub until fully healed. 3.  Do not apply ointments or hydrogen peroxide to the wounds. 4.  Do not remove  staples. 5.  Prior drain site:  continue dry gauze dressing changes once daily and as needed to keep the area clean/dry, until the wound is healed/closed.   6.  Staple incisions:  continue dry gauze dressing changes once daily and as needed to keep the area clean/dry.   Driving Restrictions   Complete by: As directed    Do not drive while taking narcotics for pain control.  Prior to driving, make sure you are able to rotate right and left to look at blindspots without significant pain or discomfort.   Increase activity slowly   Complete by: As directed    Lifting restrictions   Complete by: As directed    No heavy lifting or pushing of more than 10-15 lbs for 4 weeks.      Allergies as of 09/01/2023   No Known Allergies      Medication List     TAKE these medications    acetaminophen  500 MG tablet Commonly known as: TYLENOL  Take 1,000 mg by mouth every 6 (six) hours as needed (pain.).   amLODipine  5 MG tablet Commonly known as: NORVASC  Take 1 tablet (5 mg total) by mouth daily.   atorvastatin 20 MG tablet Commonly known as: LIPITOR Take 20 mg by mouth in the morning.   ibuprofen  600 MG tablet Commonly known as: ADVIL  Take 1 tablet (600 mg total) by mouth every 8 (  eight) hours as needed for moderate pain (pain score 4-6).   methocarbamol  500 MG tablet Commonly known as: ROBAXIN  Take 1 tablet (500 mg total) by mouth every 8 (eight) hours as needed for muscle spasms.   olmesartan 40 MG tablet Commonly known as: BENICAR Take 40 mg by mouth in the morning.   oxyCODONE  5 MG immediate release tablet Commonly known as: Oxy IR/ROXICODONE  Take 1 tablet (5 mg total) by mouth every 4 (four) hours as needed for severe pain (pain score 7-10).        Follow-up Information     [place], [place] F, MD Follow up in 1 week(s).   Specialty: General Surgery Contact information: [address] [place] [address] [phone]

## 2023-09-04 LAB — AEROBIC/ANAEROBIC CULTURE W GRAM STAIN (SURGICAL/DEEP WOUND)
Culture: NO GROWTH
Gram Stain: NONE SEEN

## 2023-09-06 ENCOUNTER — Ambulatory Visit

## 2023-09-07 ENCOUNTER — Telehealth: Payer: Self-pay | Admitting: *Deleted

## 2023-09-07 NOTE — Telephone Encounter (Signed)
 Faxed FMLA to [employer] at [phone]

## 2023-09-10 ENCOUNTER — Ambulatory Visit (INDEPENDENT_AMBULATORY_CARE_PROVIDER_SITE_OTHER): Admitting: Surgery

## 2023-09-10 ENCOUNTER — Encounter: Payer: Self-pay | Admitting: Surgery

## 2023-09-10 VITALS — BP 181/95 | HR 80 | Temp 98.1°F | Ht 73.0 in | Wt 191.2 lb

## 2023-09-10 DIAGNOSIS — Z09 Encounter for follow-up examination after completed treatment for conditions other than malignant neoplasm: Secondary | ICD-10-CM

## 2023-09-10 DIAGNOSIS — K435 Parastomal hernia without obstruction or  gangrene: Secondary | ICD-10-CM

## 2023-09-10 NOTE — Patient Instructions (Signed)
Wound Closure Removal, Care After The following information offers guidance on how to care for yourself after your stitches (sutures), staples, or adhesive strips have been removed. Your health care provider may also give you more specific instructions. If you have problems or questions, contact your health care provider. What can I expect after the procedure? After your sutures or staples have been removed or your adhesive strips have fallen off, it is common to have: Some discomfort and swelling in the area. Slight redness in the area. Follow these instructions at home: If you have a dressing: Wash your hands with soap and water for at least 20 seconds before and after you change your bandage (dressing). If soap and water are not available, use hand sanitizer. Change your dressing as told by your health care provider. If your dressing becomes wet or dirty, or develops a bad smell, change it as soon as possible. If your dressing sticks to your skin, pour warm, clean water over it until it loosens and can be removed without pulling apart the wound edges. Pat the area dry with a soft, clean towel. Do not rub the wound because that may cause bleeding. Wound care  Check your wound every day for signs of infection. Check for: More redness, swelling, or pain. Fluid or blood. New warmth, a rash, or hardness at the wound site. Pus or a bad smell. Wash your hands with soap and water for at least 20 seconds before and after touching your wound. If soap and water are not available, use hand sanitizer. Keep the wound area dry and clean. Clean and pat the wound dry as told by your health care provider. Apply cream or ointment only as told by your health care provider. If skin glue or adhesive strips were applied after sutures or staples were removed, leave these closures in place until they peel off on their own. If adhesive strip edges start to loosen and curl up, you may trim the loose edges. Do not  remove adhesive strips completely unless your health care provider tells you to do that. Continue to protect the wound from injury. Do not pick at your wound. Picking can cause an infection. Bathing Do not take baths, swim, or use a hot tub until your health care provider approves. Ask your health care provider if you may take showers. Follow these steps for showering: If you have a dressing, remove it before getting into the shower. In the shower, allow soapy water to get on the wound. Avoid scrubbing the wound. When you get out of the shower, dry the wound by patting it with a clean towel. Reapply a dressing over the wound, if needed. Scar care When your wound has completely healed, help decrease the size of your scar by: Wearing sunscreen over the scar or covering it with clothing when you are outside. New scars get sunburned easily, which can make scarring worse. Gently massaging the scarred area. This can decrease scar thickness. General instructions Take over-the-counter and prescription medicines only as told by your health care provider. Keep all follow-up visits. This is important. Contact a health care provider if: You have more redness, swelling, or pain around your wound. You have fluid or blood coming from your wound. You have new warmth, a rash, or hardness at the wound site. You have pus or a bad smell coming from your wound. Your wound opens up. Get help right away if: You have a fever or chills. You have red streaks coming from  your wound. Summary Change your dressing as told by your health care provider. If your dressing becomes wet or dirty, or develops a bad smell, change it as soon as possible. Check your wound every day for signs of infection. Wash your hands with soap and water for 20 seconds before and after touching your wound. This information is not intended to replace advice given to you by your health care provider. Make sure you discuss any questions you  have with your health care provider. Document Revised: 08/24/2020 Document Reviewed: 08/24/2020 Elsevier Patient Education  2024 [employer].

## 2023-09-11 NOTE — Progress Notes (Signed)
 Jeremiah Villarreal is 10 days out from colostomy takedown.  He has done remarkably well.  Tolerating diet no fevers no chills having bowel movements.  PE NAD Abd: Soft incisions healing well without infection.  Staples removed and Steri-Strips placed.  No peritonitis  A/P doing very well without evidence of complications.  Return to clinic in 3 months or sooner Encouraged him to follow-up with vascular to get his AAA fixed

## 2023-09-20 ENCOUNTER — Telehealth: Payer: Self-pay | Admitting: Surgery

## 2023-09-20 NOTE — Telephone Encounter (Signed)
 Spoke with the patient and let him know we can write him a note to return to work on May 12th, but we cannon say no restrictions until 6 weeks from surgery. He is fine with this. Will pick up his note tomorrow.

## 2023-09-20 NOTE — Telephone Encounter (Signed)
 Patient needs a work note stating he can return back to work with no restrictions starting May 12th, 2025.  He had colostomy closure on 08/30/23.  States he is feeling great and wants to get back to work.  Patient does not do any heavy lifting at work.  Please call patient when note is ready and he will come by to pick up.  Thank you.

## 2023-11-30 ENCOUNTER — Ambulatory Visit (INDEPENDENT_AMBULATORY_CARE_PROVIDER_SITE_OTHER): Admitting: Vascular Surgery

## 2023-11-30 ENCOUNTER — Encounter (INDEPENDENT_AMBULATORY_CARE_PROVIDER_SITE_OTHER): Payer: Self-pay | Admitting: Vascular Surgery

## 2023-11-30 VITALS — BP 120/78 | HR 91 | Ht 73.0 in | Wt 188.4 lb

## 2023-11-30 DIAGNOSIS — I745 Embolism and thrombosis of iliac artery: Secondary | ICD-10-CM | POA: Diagnosis not present

## 2023-11-30 DIAGNOSIS — Z9889 Other specified postprocedural states: Secondary | ICD-10-CM

## 2023-11-30 DIAGNOSIS — I7143 Infrarenal abdominal aortic aneurysm, without rupture: Secondary | ICD-10-CM | POA: Diagnosis not present

## 2023-11-30 DIAGNOSIS — I10 Essential (primary) hypertension: Secondary | ICD-10-CM

## 2023-11-30 NOTE — Assessment & Plan Note (Signed)
 He does have claudication symptoms from this.  We would hope to address this at the same time of his stent graft repair.  May ultimately need a femoral to femoral bypass if we cannot cross the occlusion.

## 2023-11-30 NOTE — Assessment & Plan Note (Signed)
 Now well-healed and markedly lowers the infection risk

## 2023-11-30 NOTE — Progress Notes (Signed)
 MRN : [id number]  Jeremiah Villarreal is a 58 y.o. (1965/06/09) male who presents with chief complaint of  Chief Complaint  Patient presents with   Patient will return in 6 to 8 weeks to discuss surgical the  .  History of Present Illness: Patient returns today in follow up of his abdominal aortic and iliac artery aneurysms.  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  We discussed repair at that time, but he had to have colocutaneous fistulas treated and ultimately got a diverting ostomy which was reversed about 3 months ago.  He has done well from this.  No signs of infection.  His bowel habits have returned to reasonably steady.  He had a CT scan done February 28 of this year which was almost 6 months from his original scan.  I have independently reviewed the scan as well.  This demonstrates growth of the aneurysm up to 5.5 cm in the abdominal aorta (even though the official report said the abdominal aorta was stable at 5.5 cm, that is 4 cm of growth in less than six months), and the right iliac artery aneurysm remains 3.5 to 3.6 cm in maximal diameter.  The left iliac artery remains occluded/thrombosed.  Current Outpatient Medications  Medication Sig Dispense Refill   acetaminophen  (TYLENOL ) 500 MG tablet Take 1,000 mg by mouth every 6 (six) hours as needed (pain.).     amLODipine  (NORVASC ) 5 MG tablet Take 1 tablet (5 mg total) by mouth daily. 30 tablet 2   atorvastatin (LIPITOR) 20 MG tablet Take 20 mg by mouth in the morning.     ibuprofen  (ADVIL ) 600 MG tablet Take 1 tablet (600 mg total) by mouth every 8 (eight) hours as needed for moderate pain (pain score 4-6). 60 tablet 1   methocarbamol  (ROBAXIN ) 500 MG tablet Take 1 tablet (500 mg total) by mouth every 8 (eight) hours as needed for muscle spasms. 30 tablet 1   olmesartan (BENICAR) 40 MG tablet  Take 40 mg by mouth in the morning.     oxyCODONE  (OXY IR/ROXICODONE ) 5 MG immediate release tablet Take 1 tablet (5 mg total) by mouth every 4 (four) hours as needed for severe pain (pain score 7-10). 30 tablet 0   No current facility-administered medications for this visit.    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   a.) s/p perforated Hartman procedure with colostomy for perforated diverticulitis with colovesical fistula 01/2023   COPD (chronic obstructive pulmonary disease) (HCC)    Diverticulitis of colon with perforation    a.) s/p Hartman procedure with colostomy creation 01/2023   Hepatic steatosis    Hyperlipidemia    Hypertension    Iliac artery occlusion, left (HCC)    Infrarenal abdominal aortic aneurysm (AAA) without rupture (HCC)    a.) 5.5 cm; followed by vascular; plans for [facility]    Past Surgical History:  Procedure Laterality Date   APPENDECTOMY N/A 02/06/2023   Procedure: APPENDECTOMY;  Surgeon: Jordis Laneta FALCON, MD;  Location: [facility] ORS;  Service: General;  Laterality: N/A;   BLADDER REPAIR N/A 02/06/2023   Procedure: BLADDER REPAIR;  Surgeon: Jordis Laneta FALCON, MD;  Location: [facility];  Service: General;  Laterality: N/A;   COLECTOMY WITH COLOSTOMY CREATION/HARTMANN PROCEDURE N/A  02/06/2023   Procedure: COLECTOMY WITH COLOSTOMY CREATION/HARTMANN PROCEDURE, WITH TAKEDOWN OF SPLENIC FLEXTURE;  Surgeon: Jordis Laneta FALCON, MD;  Location: [facility];  Service: General;  Laterality: N/A;   COLONOSCOPY WITH PROPOFOL  N/A 05/31/2023   Procedure: COLONOSCOPY WITH PROPOFOL ;  Surgeon: Unk Corinn Skiff, MD;  Location: [facility];  Service: Endoscopy;  Laterality: N/A;   COLOSTOMY TAKEDOWN N/A 08/30/2023   Procedure: CLOSURE, COLOSTOMY;  Surgeon: Jordis Laneta FALCON, MD;  Location: [facility];  Service: General;  Laterality: N/A;  Arthea Platt, PA-C will be assisting   POLYPECTOMY  05/31/2023   Procedure: POLYPECTOMY  INTESTINAL;  Surgeon: Unk Corinn Skiff, MD;  Location: [facility];  Service: Endoscopy;;   VENTRAL HERNIA REPAIR N/A 02/06/2023   Procedure: HERNIA REPAIR VENTRAL ADULT;  Surgeon: Jordis Laneta FALCON, MD;  Location: [facility];  Service: General;  Laterality: N/A;     Social History   Tobacco Use   Smoking status: Every Day    Current packs/day: 1.00    Average packs/day: 1 pack/day for 43.5 years (43.5 ttl pk-yrs)    Types: Cigarettes    Start date: 05/15/1980    Passive exposure: Current   Smokeless tobacco: Never   Tobacco comments:    Smokes 10 cigarettes daily; down from 2 packs per day x 40.years.    Vaping Use   Vaping status: Never Used  Substance Use Topics   Alcohol use: Yes    Alcohol/week: 18.0 standard drinks of alcohol    Types: 12 Cans of beer, 6 Shots of liquor per week    Comment: slowly decreasing amount of drinking   Drug use: Never      Family History  Problem Relation Age of Onset   Heart attack Father    Hypertension Father   No bleeding or clotting disorders  No Known Allergies   REVIEW OF SYSTEMS (Negative unless checked)   Constitutional: [] Weight loss  [] Fever  [] Chills Cardiac: [] Chest pain   [] Chest pressure   [] Palpitations   [] Shortness of breath when laying flat   [] Shortness of breath at rest   [] Shortness of breath with exertion. Vascular:  [x] Pain in legs with walking   [] Pain in legs at rest   [] Pain in legs when laying flat   [x] Claudication   [] Pain in feet when walking  [] Pain in feet at rest  [] Pain in feet when laying flat   [] History of DVT   [] Phlebitis   [] Swelling in legs   [] Varicose veins   [] Non-healing ulcers Pulmonary:   [] Uses home oxygen   [] Productive cough   [] Hemoptysis   [] Wheeze  [] COPD   [] Asthma Neurologic:  [] Dizziness  [] Blackouts   [] Seizures   [] History of stroke   [] History of TIA  [] Aphasia   [] Temporary blindness   [] Dysphagia   [] Weakness or numbness in arms   [] Weakness or numbness in legs Musculoskeletal:   [x] Arthritis   [] Joint swelling   [] Joint pain   [] Low back pain Hematologic:  [] Easy bruising  [] Easy bleeding   [] Hypercoagulable state   [] Anemic  [] Hepatitis Gastrointestinal:  [] Blood in stool   [] Vomiting blood  [] Gastroesophageal reflux/heartburn   [x] Abdominal pain Genitourinary:  [] Chronic kidney disease   [] Difficult urination  [] Frequent urination  [] Burning with urination   [] Hematuria Skin:  [] Rashes   [] Ulcers   [] Wounds Psychological:  [] History of anxiety   []  History of major depression.  Physical Examination  BP 120/78   Pulse 91   Ht 6' 1 (1.854 m)   [employer]  188 lb 6 oz (85.4 kg)   BMI 24.85 kg/m  Gen:  WD/WN, NAD. Appears older than stated age. Head: [place]/AT, No temporalis wasting. Ear/Nose/Throat: Hearing grossly intact, nares w/o erythema or drainage Eyes: Conjunctiva clear. Sclera non-icteric Neck: Supple.  Trachea midline Pulmonary:  Good air movement, no use of accessory muscles.  Cardiac: RRR, no JVD Vascular:  Vessel Right Left  Radial Palpable Palpable                          PT Palpable Not Palpable  DP Palpable Not Palpable   Gastrointestinal: soft, non-tender/non-distended.  Multiple surgical scars are now well-healed Musculoskeletal: M/S 5/5 throughout.  No deformity or atrophy.  Trace lower extremity edema. Neurologic: Sensation grossly intact in extremities.  Symmetrical.  Speech is fluent.  Psychiatric: Judgment intact, Mood & affect appropriate for pt's clinical situation. Dermatologic: No rashes or ulcers noted.  No cellulitis or open wounds.      Labs No results found for this or any previous visit (from the past 2160 hours).  Radiology No results found.  Assessment/Plan  Iliac artery occlusion, left (HCC) He does have claudication symptoms from this.  We would hope to address this at the same time of his stent graft repair.  May ultimately need a femoral to femoral bypass if we cannot cross the occlusion.  Essential  hypertension blood pressure control important in reducing the progression of atherosclerotic disease and aneurysmal growth. On appropriate oral medications.   AAA (abdominal aortic aneurysm) [facility]) He had a CT scan done February 28 of this year which was almost 6 months from his original scan.  I have independently reviewed the scan as well.  This demonstrates growth of the aneurysm up to 5.5 cm in the abdominal aorta (even though the official report said the abdominal aorta was stable at 5.5 cm, that is 4 cm of growth in less than six months), and the right iliac artery aneurysm remains 3.5 to 3.6 cm in maximal diameter.  The left iliac artery remains occluded/thrombosed. This has shown significant growth and is clearly to size that needs to be repaired to prevent lethal rupture.  We discussed with the patient that this is a somewhat complex repair that would likely require an iliac branch device on the right or less ideally coil embolization of the right hypogastric artery with extension to the external iliac artery.  We plan to recanalize the left iliac occlusion but if we cannot get across this, consideration for femoral-femoral bypass with an aorto uni iliac device will be given we discussed the risks and benefits of the procedure.  Patient voices his understanding and is agreeable to proceed  S/P colostomy takedown Now well-healed and markedly lowers the infection risk    Selinda Gu, MD  11/30/2023 12:37 PM    This note was created with Dragon medical transcription system.  Any errors from dictation are purely unintentional

## 2023-11-30 NOTE — Assessment & Plan Note (Signed)
 He had a CT scan done February 28 of this year which was almost 6 months from his original scan.  I have independently reviewed the scan as well.  This demonstrates growth of the aneurysm up to 5.5 cm in the abdominal aorta (even though the official report said the abdominal aorta was stable at 5.5 cm, that is 4 cm of growth in less than six months), and the right iliac artery aneurysm remains 3.5 to 3.6 cm in maximal diameter.  The left iliac artery remains occluded/thrombosed. This has shown significant growth and is clearly to size that needs to be repaired to prevent lethal rupture.  We discussed with the patient that this is a somewhat complex repair that would likely require an iliac branch device on the right or less ideally coil embolization of the right hypogastric artery with extension to the external iliac artery.  We plan to recanalize the left iliac occlusion but if we cannot get across this, consideration for femoral-femoral bypass with an aorto uni iliac device will be given we discussed the risks and benefits of the procedure.  Patient voices his understanding and is agreeable to proceed

## 2023-11-30 NOTE — H&P (View-Only) (Signed)
 MRN : [id number]  Jeremiah Villarreal is a 58 y.o. (1965/06/09) male who presents with chief complaint of  Chief Complaint  Patient presents with   Patient will return in 6 to 8 weeks to discuss surgical the  .  History of Present Illness: Patient returns today in follow up of his abdominal aortic and iliac artery aneurysms.  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  We discussed repair at that time, but he had to have colocutaneous fistulas treated and ultimately got a diverting ostomy which was reversed about 3 months ago.  He has done well from this.  No signs of infection.  His bowel habits have returned to reasonably steady.  He had a CT scan done February 28 of this year which was almost 6 months from his original scan.  I have independently reviewed the scan as well.  This demonstrates growth of the aneurysm up to 5.5 cm in the abdominal aorta (even though the official report said the abdominal aorta was stable at 5.5 cm, that is 4 cm of growth in less than six months), and the right iliac artery aneurysm remains 3.5 to 3.6 cm in maximal diameter.  The left iliac artery remains occluded/thrombosed.  Current Outpatient Medications  Medication Sig Dispense Refill   acetaminophen  (TYLENOL ) 500 MG tablet Take 1,000 mg by mouth every 6 (six) hours as needed (pain.).     amLODipine  (NORVASC ) 5 MG tablet Take 1 tablet (5 mg total) by mouth daily. 30 tablet 2   atorvastatin (LIPITOR) 20 MG tablet Take 20 mg by mouth in the morning.     ibuprofen  (ADVIL ) 600 MG tablet Take 1 tablet (600 mg total) by mouth every 8 (eight) hours as needed for moderate pain (pain score 4-6). 60 tablet 1   methocarbamol  (ROBAXIN ) 500 MG tablet Take 1 tablet (500 mg total) by mouth every 8 (eight) hours as needed for muscle spasms. 30 tablet 1   olmesartan (BENICAR) 40 MG tablet  Take 40 mg by mouth in the morning.     oxyCODONE  (OXY IR/ROXICODONE ) 5 MG immediate release tablet Take 1 tablet (5 mg total) by mouth every 4 (four) hours as needed for severe pain (pain score 7-10). 30 tablet 0   No current facility-administered medications for this visit.    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   a.) s/p perforated Hartman procedure with colostomy for perforated diverticulitis with colovesical fistula 01/2023   COPD (chronic obstructive pulmonary disease) (HCC)    Diverticulitis of colon with perforation    a.) s/p Hartman procedure with colostomy creation 01/2023   Hepatic steatosis    Hyperlipidemia    Hypertension    Iliac artery occlusion, left (HCC)    Infrarenal abdominal aortic aneurysm (AAA) without rupture (HCC)    a.) 5.5 cm; followed by vascular; plans for [facility]    Past Surgical History:  Procedure Laterality Date   APPENDECTOMY N/A 02/06/2023   Procedure: APPENDECTOMY;  Surgeon: Jordis Laneta FALCON, MD;  Location: [facility];  Service: General;  Laterality: N/A;   BLADDER REPAIR N/A 02/06/2023   Procedure: BLADDER REPAIR;  Surgeon: Jordis Laneta FALCON, MD;  Location: [facility];  Service: General;  Laterality: N/A;   COLECTOMY WITH COLOSTOMY CREATION/HARTMANN PROCEDURE N/A  02/06/2023   Procedure: COLECTOMY WITH COLOSTOMY CREATION/HARTMANN PROCEDURE, WITH TAKEDOWN OF SPLENIC FLEXTURE;  Surgeon: Jordis Laneta FALCON, MD;  Location: [facility];  Service: General;  Laterality: N/A;   COLONOSCOPY WITH PROPOFOL  N/A 05/31/2023   Procedure: COLONOSCOPY WITH PROPOFOL ;  Surgeon: Unk Corinn Skiff, MD;  Location: [facility];  Service: Endoscopy;  Laterality: N/A;   COLOSTOMY TAKEDOWN N/A 08/30/2023   Procedure: CLOSURE, COLOSTOMY;  Surgeon: Jordis Laneta FALCON, MD;  Location: [facility];  Service: General;  Laterality: N/A;  Arthea Platt, PA-C will be assisting   POLYPECTOMY  05/31/2023   Procedure: POLYPECTOMY  INTESTINAL;  Surgeon: Unk Corinn Skiff, MD;  Location: [facility];  Service: Endoscopy;;   VENTRAL HERNIA REPAIR N/A 02/06/2023   Procedure: HERNIA REPAIR VENTRAL ADULT;  Surgeon: Jordis Laneta FALCON, MD;  Location: [facility];  Service: General;  Laterality: N/A;     Social History   Tobacco Use   Smoking status: Every Day    Current packs/day: 1.00    Average packs/day: 1 pack/day for 43.5 years (43.5 ttl pk-yrs)    Types: Cigarettes    Start date: 05/15/1980    Passive exposure: Current   Smokeless tobacco: Never   Tobacco comments:    Smokes 10 cigarettes daily; down from 2 packs per day x 40.years.    Vaping Use   Vaping status: Never Used  Substance Use Topics   Alcohol use: Yes    Alcohol/week: 18.0 standard drinks of alcohol    Types: 12 Cans of beer, 6 Shots of liquor per week    Comment: slowly decreasing amount of drinking   Drug use: Never      Family History  Problem Relation Age of Onset   Heart attack Father    Hypertension Father   No bleeding or clotting disorders  No Known Allergies   REVIEW OF SYSTEMS (Negative unless checked)   Constitutional: [] Weight loss  [] Fever  [] Chills Cardiac: [] Chest pain   [] Chest pressure   [] Palpitations   [] Shortness of breath when laying flat   [] Shortness of breath at rest   [] Shortness of breath with exertion. Vascular:  [x] Pain in legs with walking   [] Pain in legs at rest   [] Pain in legs when laying flat   [x] Claudication   [] Pain in feet when walking  [] Pain in feet at rest  [] Pain in feet when laying flat   [] History of DVT   [] Phlebitis   [] Swelling in legs   [] Varicose veins   [] Non-healing ulcers Pulmonary:   [] Uses home oxygen   [] Productive cough   [] Hemoptysis   [] Wheeze  [] COPD   [] Asthma Neurologic:  [] Dizziness  [] Blackouts   [] Seizures   [] History of stroke   [] History of TIA  [] Aphasia   [] Temporary blindness   [] Dysphagia   [] Weakness or numbness in arms   [] Weakness or numbness in legs Musculoskeletal:   [x] Arthritis   [] Joint swelling   [] Joint pain   [] Low back pain Hematologic:  [] Easy bruising  [] Easy bleeding   [] Hypercoagulable state   [] Anemic  [] Hepatitis Gastrointestinal:  [] Blood in stool   [] Vomiting blood  [] Gastroesophageal reflux/heartburn   [x] Abdominal pain Genitourinary:  [] Chronic kidney disease   [] Difficult urination  [] Frequent urination  [] Burning with urination   [] Hematuria Skin:  [] Rashes   [] Ulcers   [] Wounds Psychological:  [] History of anxiety   []  History of major depression.  Physical Examination  BP 120/78   Pulse 91   Ht 6' 1 (1.854 m)   [employer]  188 lb 6 oz (85.4 kg)   BMI 24.85 kg/m  Gen:  WD/WN, NAD. Appears older than stated age. Head: [place]/AT, No temporalis wasting. Ear/Nose/Throat: Hearing grossly intact, nares w/o erythema or drainage Eyes: Conjunctiva clear. Sclera non-icteric Neck: Supple.  Trachea midline Pulmonary:  Good air movement, no use of accessory muscles.  Cardiac: RRR, no JVD Vascular:  Vessel Right Left  Radial Palpable Palpable                          PT Palpable Not Palpable  DP Palpable Not Palpable   Gastrointestinal: soft, non-tender/non-distended.  Multiple surgical scars are now well-healed Musculoskeletal: M/S 5/5 throughout.  No deformity or atrophy.  Trace lower extremity edema. Neurologic: Sensation grossly intact in extremities.  Symmetrical.  Speech is fluent.  Psychiatric: Judgment intact, Mood & affect appropriate for pt's clinical situation. Dermatologic: No rashes or ulcers noted.  No cellulitis or open wounds.      Labs No results found for this or any previous visit (from the past 2160 hours).  Radiology No results found.  Assessment/Plan  Iliac artery occlusion, left (HCC) He does have claudication symptoms from this.  We would hope to address this at the same time of his stent graft repair.  May ultimately need a femoral to femoral bypass if we cannot cross the occlusion.  Essential  hypertension blood pressure control important in reducing the progression of atherosclerotic disease and aneurysmal growth. On appropriate oral medications.   AAA (abdominal aortic aneurysm) [facility]) He had a CT scan done February 28 of this year which was almost 6 months from his original scan.  I have independently reviewed the scan as well.  This demonstrates growth of the aneurysm up to 5.5 cm in the abdominal aorta (even though the official report said the abdominal aorta was stable at 5.5 cm, that is 4 cm of growth in less than six months), and the right iliac artery aneurysm remains 3.5 to 3.6 cm in maximal diameter.  The left iliac artery remains occluded/thrombosed. This has shown significant growth and is clearly to size that needs to be repaired to prevent lethal rupture.  We discussed with the patient that this is a somewhat complex repair that would likely require an iliac branch device on the right or less ideally coil embolization of the right hypogastric artery with extension to the external iliac artery.  We plan to recanalize the left iliac occlusion but if we cannot get across this, consideration for femoral-femoral bypass with an aorto uni iliac device will be given we discussed the risks and benefits of the procedure.  Patient voices his understanding and is agreeable to proceed  S/P colostomy takedown Now well-healed and markedly lowers the infection risk    Selinda Gu, MD  11/30/2023 12:37 PM    This note was created with Dragon medical transcription system.  Any errors from dictation are purely unintentional

## 2023-11-30 NOTE — Assessment & Plan Note (Signed)
blood pressure control important in reducing the progression of atherosclerotic disease and aneurysmal growth. On appropriate oral medications.  

## 2023-12-04 ENCOUNTER — Telehealth (INDEPENDENT_AMBULATORY_CARE_PROVIDER_SITE_OTHER): Payer: Self-pay

## 2023-12-04 NOTE — Telephone Encounter (Signed)
 Spoke with the patient and he is scheduled with Dr. Marea on 12/19/23 with a 6:30 am arrival time to the [facility] for a AAA stent graft repair. Pre-admit will call to schedule pre-op at the [facility]. Pre-surgical instructions were discussed and will be mailed.

## 2023-12-07 ENCOUNTER — Other Ambulatory Visit

## 2023-12-10 ENCOUNTER — Encounter: Payer: Self-pay | Admitting: Surgery

## 2023-12-10 ENCOUNTER — Ambulatory Visit: Admitting: Surgery

## 2023-12-10 ENCOUNTER — Other Ambulatory Visit (INDEPENDENT_AMBULATORY_CARE_PROVIDER_SITE_OTHER): Payer: Self-pay | Admitting: Nurse Practitioner

## 2023-12-10 ENCOUNTER — Other Ambulatory Visit: Payer: Self-pay

## 2023-12-10 ENCOUNTER — Encounter
Admission: RE | Admit: 2023-12-10 | Discharge: 2023-12-10 | Disposition: A | Discharge status: Home / Self Care | Source: Ambulatory Visit | Attending: Vascular Surgery | Admitting: Vascular Surgery

## 2023-12-10 VITALS — Resp 14 | Wt 195.3 lb

## 2023-12-10 VITALS — BP 150/84 | HR 85 | Temp 98.1°F | Ht 73.0 in | Wt 193.8 lb

## 2023-12-10 DIAGNOSIS — Z01818 Encounter for other preprocedural examination: Secondary | ICD-10-CM

## 2023-12-10 DIAGNOSIS — I7143 Infrarenal abdominal aortic aneurysm, without rupture: Secondary | ICD-10-CM

## 2023-12-10 DIAGNOSIS — Z01812 Encounter for preprocedural laboratory examination: Secondary | ICD-10-CM | POA: Diagnosis present

## 2023-12-10 DIAGNOSIS — Z09 Encounter for follow-up examination after completed treatment for conditions other than malignant neoplasm: Secondary | ICD-10-CM

## 2023-12-10 DIAGNOSIS — K435 Parastomal hernia without obstruction or  gangrene: Secondary | ICD-10-CM | POA: Diagnosis not present

## 2023-12-10 HISTORY — DX: Nicotine dependence, cigarettes, uncomplicated: F17.210

## 2023-12-10 HISTORY — DX: Other specified postprocedural states: Z98.890

## 2023-12-10 HISTORY — DX: Colostomy status: Z93.3

## 2023-12-10 HISTORY — DX: Polyp of colon: K63.5

## 2023-12-10 LAB — SURGICAL PCR SCREEN
MRSA, PCR: NEGATIVE
Staphylococcus aureus: NEGATIVE

## 2023-12-10 LAB — BASIC METABOLIC PANEL WITH GFR
Anion gap: 11 (ref 5–15)
BUN: 11 mg/dL (ref 6–20)
CO2: 23 mmol/L (ref 22–32)
Calcium: 8.8 mg/dL — ABNORMAL LOW (ref 8.9–10.3)
Chloride: 112 mmol/L — ABNORMAL HIGH (ref 98–111)
Creatinine, Ser: 0.79 mg/dL (ref 0.61–1.24)
GFR, Estimated: >60 mL/min (ref >60)
Glucose, Bld: 93 mg/dL (ref 70–99)
Potassium: 4.1 mmol/L (ref 3.5–5.1)
Sodium: 146 mmol/L — ABNORMAL HIGH (ref 135–145)

## 2023-12-10 LAB — CBC WITH DIFFERENTIAL/PLATELET
Abs Immature Granulocytes: 0.06 K/uL (ref 0.00–0.07)
Basophils Absolute: 0.1 K/uL (ref 0.0–0.1)
Basophils Relative: 1 %
Eosinophils Absolute: 0.2 K/uL (ref 0.0–0.5)
Eosinophils Relative: 2 %
HCT: 47.3 % (ref 39.0–52.0)
Hemoglobin: 16.2 g/dL (ref 13.0–17.0)
Immature Granulocytes: 1 %
Lymphocytes Relative: 25 %
Lymphs Abs: 2.4 K/uL (ref 0.7–4.0)
MCH: 36.2 pg — ABNORMAL HIGH (ref 26.0–34.0)
MCHC: 34.2 g/dL (ref 30.0–36.0)
MCV: 105.8 fL — ABNORMAL HIGH (ref 80.0–100.0)
Monocytes Absolute: 0.7 K/uL (ref 0.1–1.0)
Monocytes Relative: 7 %
Neutro Abs: 6.3 K/uL (ref 1.7–7.7)
Neutrophils Relative %: 64 %
Platelets: 233 K/uL (ref 150–400)
RBC: 4.47 MIL/uL (ref 4.22–5.81)
RDW: 12.7 % (ref 11.5–15.5)
WBC: 9.7 K/uL (ref 4.0–10.5)
nRBC: 0 % (ref 0.0–0.2)

## 2023-12-10 NOTE — Progress Notes (Signed)
 Outpatient Surgical Follow Up  12/10/2023  Jeremiah Villarreal is an 58 y.o. male.   Chief Complaint  Patient presents with   Follow-up    Colostomy reversal 08/30/23    HPI: [name] is doing very well following colostomy reversal. 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   a.) s/p perforated Hartman procedure with colostomy for perforated diverticulitis with colovesical fistula 01/2023   COPD (chronic obstructive pulmonary disease) (HCC)    Diverticulitis of colon with perforation    a.) s/p Hartman procedure with colostomy creation 01/2023   Hepatic steatosis    Hyperlipidemia    Hypertension    Iliac artery occlusion, left (HCC)    Infrarenal abdominal aortic aneurysm (AAA) without rupture (HCC)    a.) 5.5 cm; followed by vascular; plans for [facility]   Polyp of cecum    S/P colostomy (HCC)    S/P colostomy takedown     Past Surgical History:  Procedure Laterality Date   APPENDECTOMY N/A 02/06/2023   Procedure: APPENDECTOMY;  Surgeon: Jordis Laneta FALCON, MD;  Location: [facility];  Service: General;  Laterality: N/A;   BLADDER REPAIR N/A 02/06/2023   Procedure: BLADDER REPAIR;  Surgeon: Jordis Laneta FALCON, MD;  Location: [facility];  Service: General;  Laterality: N/A;   COLECTOMY WITH COLOSTOMY CREATION/HARTMANN PROCEDURE N/A 02/06/2023   Procedure: COLECTOMY WITH COLOSTOMY CREATION/HARTMANN PROCEDURE, WITH TAKEDOWN OF SPLENIC FLEXTURE;  Surgeon: Jordis Laneta FALCON, MD;  Location: [facility];  Service: General;  Laterality: N/A;   COLONOSCOPY WITH PROPOFOL  N/A 05/31/2023   Procedure: COLONOSCOPY WITH PROPOFOL ;  Surgeon: Unk Corinn Skiff, MD;  Location: [facility];  Service: Endoscopy;  Laterality: N/A;   COLOSTOMY TAKEDOWN N/A 08/30/2023   Procedure: CLOSURE, COLOSTOMY;  Surgeon: Jordis Laneta FALCON, MD;  Location: [facility];  Service:  General;  Laterality: N/A;  Arthea Platt, PA-C will be assisting   POLYPECTOMY  05/31/2023   Procedure: POLYPECTOMY INTESTINAL;  Surgeon: Unk Corinn Skiff, MD;  Location: [facility];  Service: Endoscopy;;   VENTRAL HERNIA REPAIR N/A 02/06/2023   Procedure: HERNIA REPAIR VENTRAL ADULT;  Surgeon: Jordis Laneta FALCON, MD;  Location: [facility];  Service: General;  Laterality: N/A;    Family History  Problem Relation Age of Onset   Heart attack Father    Hypertension Father     Social History:  reports that he has been smoking cigarettes. He started smoking about 43 years ago. He has a 43.6 pack-year smoking history. He has been exposed to tobacco smoke. He has never used smokeless tobacco. He reports current alcohol use of about 18.0 standard drinks of alcohol per week. He reports that he does not use drugs.  Allergies: No Known Allergies  Medications reviewed.    ROS Full ROS performed and is otherwise negative other than what is stated in HPI   BP (!) 150/84   Pulse 85   Temp 98.1 F (36.7 C) (Oral)   Ht 6' 1 (1.854 m)   Wt 193 lb 12.8 oz (87.9 kg)   SpO2 97%   BMI 25.57 kg/m   Physical Exam Vitals and nursing note reviewed. Exam conducted with a chaperone present.  Constitutional:      General: He is not in acute distress.    Appearance: Normal appearance.  Pulmonary:     Effort: Pulmonary effort is  normal.     Breath sounds: No stridor.  Abdominal:     General: Abdomen is flat. There is no distension.     Palpations: Abdomen is soft. There is no mass.     Tenderness: There is no abdominal tenderness. There is no guarding.     Hernia: No hernia is present.     Comments: Midline incision healed, no infection/ no rebound  Neurological:     General: No focal deficit present.     Mental Status: He is alert and oriented to person, place, and time.  Psychiatric:        Mood and Affect: Mood normal.        Behavior: Behavior normal.        Thought Content: Thought  content normal.        Judgment: Judgment normal.       Assessment/Plan: Doing well after colostomy reversal, not having any complication AAA to be done in 10 days or so RTC prn  Laneta Luna, MD [facility] General Surgeon

## 2023-12-10 NOTE — Patient Instructions (Signed)
 Colostomy Reversal Surgery, Care After The following information offers guidance on how to care for yourself after your procedure. Your health care provider may also give you more specific instructions. If you have problems or questions, contact your health care provider. What can I expect after the procedure? After the procedure, it is common to have: Pain and discomfort in your abdomen, especially near your incision. Decreased appetite. You may have temporary bowel changes, such as: Passing gas (flatulence). An urgent need to have a bowel movement. More frequent bowel movements. Some leakage of stool or the inability to control when you have bowel movements (incontinence). This may cause skin soreness around your rectum due to exposure to stool and wiping of skin. This usually gets better within a couple weeks. Loose stools or diarrhea. Constipation. Follow these instructions at home: Activity  You may have to avoid lifting. Ask your health care provider how much you can safely lift. Return to your normal activities as told by your health care provider. Ask your health care provider what activities are safe for you. Avoid sitting for a long time without moving. Get up to take short walks every 1-2 hours. This is important to improve blood flow and breathing. Ask for help if you feel weak or unsteady. Avoid activities that take a lot of effort, contact sports, and abdominal exercises for 4 weeks or as long as told by your health care provider. Incision care  Follow instructions from your health care provider about how to take care of your incision. Make sure you: Wash your hands with soap and water  for at least 20 seconds before and after you change your bandage (dressing). If soap and water  are not available, use hand sanitizer. Change your dressing as told by your health care provider. Leave stitches (sutures), skin glue, or adhesive strips in place. These skin closures may need to stay  in place for 2 weeks or longer. If adhesive strip edges start to loosen and curl up, you may trim the loose edges. Do not remove adhesive strips completely unless your health care provider tells you to do that. Keep the incision area clean and dry. Check your incision area every day for signs of infection. Check for: More redness, swelling, or pain. More fluid or blood. Warmth. Pus or a bad smell. To protect the incision, hold a folded blanket or small pillow against your abdomen when coughing, sneezing, or bending. Bathing Do not take baths, swim, or use a hot tub until your health care provider approves. Ask your health care provider if you may take showers. You may only be allowed to take sponge baths. If your health care provider approves bathing and showering, cover the dressing with a watertight covering to protect it from water . Do not let the dressing get wet. Keep the dressing dry until your health care provider says it can be removed. Driving If you were given a sedative during the procedure, it can affect you for several hours. Do not drive or operate machinery until your health care provider says that it is safe. Ask your health care provider if the medicine prescribed to you requires you to avoid driving or using machinery. Follow other driving restrictions as told by your health care provider. Eating and drinking Follow instructions from your health care provider about eating or drinking restrictions. This may include: What to eat and drink. You may be told to start eating a bland diet. Over time, you may slowly return to a more normal, healthy  diet. How much to eat and drink. You should eat small meals often and stop eating when you feel full. Take nutrition supplements as told by your health care provider or dietitian. General instructions Take over-the-counter and prescription medicines only as told by your health care provider. Take steps to treat diarrhea or constipation as  told by your health care provider. Your health care provider may recommend that you: Drink enough fluid to keep your urine pale yellow. Avoid fluids that contain a lot of sugar or caffeine, such as energy drinks, sports drinks, and soda. Eat bland, easy-to-digest foods in small amounts as you are able. These foods include bananas, applesauce, rice, lean meats, toast, and crackers. Take over-the-counter or prescription medicines. Limit foods that are high in fat and processed sugars, such as fried or sweet foods. If you have skin soreness around your rectum, apply a skin barrier ointment or paste. This can help prevent irritation from occurring or getting worse. Do not use any products that contain nicotine or tobacco. These products include cigarettes, chewing tobacco, and vaping devices, such as e-cigarettes. These can delay incision healing after surgery. If you need help quitting, ask your health care provider. Keep all follow-up visits. This is important. Contact a health care provider if: You have any of these signs of infection: More redness, swelling, or pain at the site of your incision. More fluid or blood coming from your incision. Warmth coming from your incision. Pus or a bad smell coming from your incision. A fever. Your incision breaks open. You feel nauseous. You are constipated, or not able to have a bowel movement. Your diarrhea gets worse. You have pain that is not controlled with medicine. Get help right away if: You have abdominal pain that does not go away or becomes severe. You have frequent vomiting and you are not able to eat or drink. You have difficulty breathing. Summary After colostomy reversal surgery, it is common to have abdominal pain, decreased appetite, diarrhea, or constipation. Follow instructions from your health care provider about how to take care of your incision. Do not let the dressing get wet. Take over-the-counter and prescription medicines only  as told by your health care provider. Contact your health care provider if you are constipated, or not able to have a bowel movement. Keep all follow-up visits. This is important. This information is not intended to replace advice given to you by your health care provider. Make sure you discuss any questions you have with your health care provider. Document Revised: 12/05/2022 Document Reviewed: 12/24/2020 Elsevier Patient Education  2024 [employer].

## 2023-12-10 NOTE — Patient Instructions (Addendum)
 Your procedure is scheduled on:12-19-23 Wednesday [facility] Arrive at 6:30 AM   REMEMBER: Instructions that are not followed completely may result in serious medical risk, up to and including death; or upon the discretion of your surgeon and anesthesiologist your surgery may need to be rescheduled.  Do not eat food OR drink liquids after midnight the night before surgery.  No gum chewing or hard candies.  One week prior to surgery:Stop NOW (12-10-23) Stop Anti-inflammatories (NSAIDS) such as Advil , Aleve, Ibuprofen , Motrin , Naproxen, Naprosyn and Aspirin based products such as Excedrin, Goody's Powder, BC Powder. Stop ANY OVER THE COUNTER supplements until after surgery.  You may however, continue to take Tylenol  if needed for pain up until the day of surgery.  Continue taking all of your other prescription medications up until the day of surgery.  ON THE DAY OF SURGERY ONLY TAKE THESE MEDICATIONS WITH SIPS OF WATER : -amLODipine  (NORVASC )  -atorvastatin (LIPITOR)   No Alcohol for 24 hours before or after surgery.  No Smoking including e-cigarettes for 24 hours before surgery.  No chewable tobacco products for at least 6 hours before surgery.  No nicotine  patches on the day of surgery.  Do not use any recreational drugs for at least a week (preferably 2 weeks) before your surgery.  Please be advised that the combination of cocaine and anesthesia may have negative outcomes, up to and including death. If you test positive for cocaine, your surgery will be cancelled.  On the morning of surgery brush your teeth with toothpaste and water , you may rinse your mouth with mouthwash if you wish. Do not swallow any toothpaste or mouthwash.  Use CHG Soap as directed on instruction sheet.  Do not wear jewelry, make-up, hairpins, clips or nail polish.  For welded (permanent) jewelry: bracelets, anklets, waist bands, etc.  Please have this removed prior to surgery.   If it is not removed, there is a chance that hospital personnel will need to cut it off on the day of surgery.  Do not wear lotions, powders, or perfumes.   Do not shave body hair from the neck down 48 hours before surgery.  Contact lenses, hearing aids and dentures may not be worn into surgery.  Do not bring valuables to the hospital. [facility] is not responsible for any missing/lost belongings or valuables.   Notify your doctor if there is any change in your medical condition (cold, fever, infection).  Wear comfortable clothing (specific to your surgery type) to the hospital.  After surgery, you can help prevent lung complications by doing breathing exercises.  Take deep breaths and cough every 1-2 hours. Your doctor may order a device called an Incentive Spirometer to help you take deep breaths. When coughing or sneezing, hold a pillow firmly against your incision with both hands. This is called "splinting." Doing this helps protect your incision. It also decreases belly discomfort.  If you are being admitted to the hospital overnight, leave your suitcase in the car. After surgery it may be brought to your room.  In case of increased patient census, it may be necessary for you, the patient, to continue your postoperative care in the Same Day Surgery department.  If you are being discharged the day of surgery, you will not be allowed to drive home. You will need a responsible individual to drive you home and stay with you for 24 hours after surgery.   If you are taking public transportation, you will need to  have a responsible individual with you.  Please call the Pre-admissions Testing Dept. at [phone] if you have any questions about these instructions.  Surgery Visitation Policy:  Patients having surgery or a procedure may have two visitors.  Children under the age of 15 must have an adult with them who is not the patient.  Inpatient Visitation:    Visiting hours are  7 a.m. to 8 p.m. Up to four visitors are allowed at one time in a patient room. The visitors may rotate out with other people during the day.  One visitor age 57 or older may stay with the patient overnight and must be in the room by 8 p.m.     Preparing for Surgery with CHLORHEXIDINE  GLUCONATE (CHG) Soap  Chlorhexidine  Gluconate (CHG) Soap  o An antiseptic cleaner that kills germs and bonds with the skin to continue killing germs even after washing  o Used for showering the night before surgery and morning of surgery  Before surgery, you can play an important role by reducing the number of germs on your skin.  CHG (Chlorhexidine  gluconate) soap is an antiseptic cleanser which kills germs and bonds with the skin to continue killing germs even after washing.  Please do not use if you have an allergy to CHG or antibacterial soaps. If your skin becomes reddened/irritated stop using the CHG.  1. Shower the NIGHT BEFORE SURGERY and the MORNING OF SURGERY with CHG soap.  2. If you choose to wash your hair, wash your hair first as usual with your normal shampoo.  3. After shampooing, rinse your hair and body thoroughly to remove the shampoo.  4. Use CHG as you would any other liquid soap. You can apply CHG directly to the skin and wash gently with a scrungie or a clean washcloth.  5. Apply the CHG soap to your body only from the neck down. Do not use on open wounds or open sores. Avoid contact with your eyes, ears, mouth, and genitals (private parts). Wash face and genitals (private parts) with your normal soap.  6. Wash thoroughly, paying special attention to the area where your surgery will be performed.  7. Thoroughly rinse your body with warm water .  8. Do not shower/wash with your normal soap after using and rinsing off the CHG soap.  9. Pat yourself dry with a clean towel.  10. Wear clean pajamas to bed the night before surgery.  12. Place clean sheets on your bed the night of  your first shower and do not sleep with pets.  13. Shower again with the CHG soap on the day of surgery prior to arriving at the hospital.  14. Do not apply any deodorants/lotions/powders.  15. Please wear clean clothes to the hospital.   [employer] to address health-related social needs:  [email or web address]

## 2023-12-19 ENCOUNTER — Encounter
Admission: RE | Disposition: A | Discharge status: Home / Self Care | Payer: Self-pay | Source: Home / Self Care | Attending: Vascular Surgery

## 2023-12-19 ENCOUNTER — Other Ambulatory Visit: Payer: Self-pay

## 2023-12-19 ENCOUNTER — Inpatient Hospital Stay: Payer: Self-pay | Admitting: Urgent Care

## 2023-12-19 ENCOUNTER — Inpatient Hospital Stay
Admission: RE | Admit: 2023-12-19 | Discharge: 2023-12-21 | DRG: 269 | Disposition: A | Discharge status: Home / Self Care | Attending: Vascular Surgery | Admitting: Vascular Surgery

## 2023-12-19 ENCOUNTER — Inpatient Hospital Stay: Payer: Self-pay | Admitting: Anesthesiology

## 2023-12-19 ENCOUNTER — Encounter: Payer: Self-pay | Admitting: Vascular Surgery

## 2023-12-19 DIAGNOSIS — Z79899 Other long term (current) drug therapy: Secondary | ICD-10-CM | POA: Diagnosis not present

## 2023-12-19 DIAGNOSIS — I714 Abdominal aortic aneurysm, without rupture, unspecified: Principal | ICD-10-CM | POA: Diagnosis present

## 2023-12-19 DIAGNOSIS — Z9049 Acquired absence of other specified parts of digestive tract: Secondary | ICD-10-CM | POA: Diagnosis not present

## 2023-12-19 DIAGNOSIS — I739 Peripheral vascular disease, unspecified: Secondary | ICD-10-CM | POA: Diagnosis present

## 2023-12-19 DIAGNOSIS — F17210 Nicotine dependence, cigarettes, uncomplicated: Secondary | ICD-10-CM | POA: Diagnosis present

## 2023-12-19 DIAGNOSIS — I708 Atherosclerosis of other arteries: Secondary | ICD-10-CM | POA: Diagnosis present

## 2023-12-19 DIAGNOSIS — J449 Chronic obstructive pulmonary disease, unspecified: Secondary | ICD-10-CM | POA: Diagnosis present

## 2023-12-19 DIAGNOSIS — Z8249 Family history of ischemic heart disease and other diseases of the circulatory system: Secondary | ICD-10-CM

## 2023-12-19 DIAGNOSIS — E785 Hyperlipidemia, unspecified: Secondary | ICD-10-CM | POA: Diagnosis present

## 2023-12-19 DIAGNOSIS — Z860100 Personal history of colon polyps, unspecified: Secondary | ICD-10-CM | POA: Diagnosis not present

## 2023-12-19 DIAGNOSIS — I10 Essential (primary) hypertension: Secondary | ICD-10-CM | POA: Diagnosis present

## 2023-12-19 DIAGNOSIS — I723 Aneurysm of iliac artery: Secondary | ICD-10-CM

## 2023-12-19 DIAGNOSIS — K760 Fatty (change of) liver, not elsewhere classified: Secondary | ICD-10-CM | POA: Diagnosis present

## 2023-12-19 DIAGNOSIS — I745 Embolism and thrombosis of iliac artery: Secondary | ICD-10-CM

## 2023-12-19 DIAGNOSIS — I70202 Unspecified atherosclerosis of native arteries of extremities, left leg: Secondary | ICD-10-CM | POA: Diagnosis not present

## 2023-12-19 DIAGNOSIS — Z7982 Long term (current) use of aspirin: Secondary | ICD-10-CM | POA: Diagnosis not present

## 2023-12-19 DIAGNOSIS — Z7902 Long term (current) use of antithrombotics/antiplatelets: Secondary | ICD-10-CM | POA: Diagnosis not present

## 2023-12-19 HISTORY — PX: ENDOVASCULAR REPAIR/STENT GRAFT: CATH118280

## 2023-12-19 LAB — MAGNESIUM: Magnesium: 1.6 mg/dL — ABNORMAL LOW (ref 1.7–2.4)

## 2023-12-19 LAB — MRSA NEXT GEN BY PCR, NASAL: MRSA by PCR Next Gen: NOT DETECTED

## 2023-12-19 LAB — BASIC METABOLIC PANEL WITH GFR
Anion gap: 8 (ref 5–15)
BUN: 13 mg/dL (ref 6–20)
CO2: 25 mmol/L (ref 22–32)
Calcium: 7.5 mg/dL — ABNORMAL LOW (ref 8.9–10.3)
Chloride: 109 mmol/L (ref 98–111)
Creatinine, Ser: 0.71 mg/dL (ref 0.61–1.24)
GFR, Estimated: >60 mL/min (ref >60)
Glucose, Bld: 219 mg/dL — ABNORMAL HIGH (ref 70–99)
Potassium: 4.2 mmol/L (ref 3.5–5.1)
Sodium: 142 mmol/L (ref 135–145)

## 2023-12-19 LAB — CBC
HCT: 41.4 % (ref 39.0–52.0)
HCT: 43 % (ref 39.0–52.0)
Hemoglobin: 14.3 g/dL (ref 13.0–17.0)
Hemoglobin: 14.1 g/dL (ref 13.0–17.0)
MCH: 34.3 pg — ABNORMAL HIGH (ref 26.0–34.0)
MCH: 34.7 pg — ABNORMAL HIGH (ref 26.0–34.0)
MCHC: 34.5 g/dL (ref 30.0–36.0)
MCHC: 32.8 g/dL (ref 30.0–36.0)
MCV: 99.3 fL (ref 80.0–100.0)
MCV: 105.9 fL — ABNORMAL HIGH (ref 80.0–100.0)
Platelets: 108 K/uL — ABNORMAL LOW (ref 150–400)
Platelets: 103 K/uL — ABNORMAL LOW (ref 150–400)
RBC: 4.17 MIL/uL — ABNORMAL LOW (ref 4.22–5.81)
RBC: 4.06 MIL/uL — ABNORMAL LOW (ref 4.22–5.81)
RDW: 17.4 % — ABNORMAL HIGH (ref 11.5–15.5)
RDW: 18.4 % — ABNORMAL HIGH (ref 11.5–15.5)
WBC: 12.7 K/uL — ABNORMAL HIGH (ref 4.0–10.5)
WBC: 11.9 K/uL — ABNORMAL HIGH (ref 4.0–10.5)
nRBC: 0.2 % (ref 0.0–0.2)
nRBC: 0 % (ref 0.0–0.2)

## 2023-12-19 LAB — DIC (DISSEMINATED INTRAVASCULAR COAGULATION)PANEL
D-Dimer, Quant: 11.58 ug{FEU}/mL — ABNORMAL HIGH (ref 0.00–0.50)
Fibrinogen: 267 mg/dL (ref 210–475)
INR: 1.2 (ref 0.8–1.2)
Platelets: 101 K/uL — ABNORMAL LOW (ref 150–400)
Prothrombin Time: 15.6 s — ABNORMAL HIGH (ref 11.4–15.2)
Smear Review: NORMAL
aPTT: 29 s (ref 24–36)

## 2023-12-19 LAB — PREPARE RBC (CROSSMATCH)

## 2023-12-19 LAB — GLUCOSE, CAPILLARY: Glucose-Capillary: 220 mg/dL — ABNORMAL HIGH (ref 70–99)

## 2023-12-19 SURGERY — ENDOVASCULAR STENT GRAFT (AAA)
Anesthesia: General

## 2023-12-19 MED ORDER — HYDRALAZINE HCL 20 MG/ML IJ SOLN: 5.0000 mg | INTRAMUSCULAR | Status: DC | PRN

## 2023-12-19 MED ORDER — GLYCOPYRROLATE 0.2 MG/ML IJ SOLN
INTRAMUSCULAR | Status: DC | PRN
  Administered 2023-12-19: .2 mg via INTRAVENOUS

## 2023-12-19 MED ORDER — FENTANYL CITRATE (PF) 100 MCG/2ML IJ SOLN
INTRAMUSCULAR | Status: DC | PRN
  Administered 2023-12-19: 100 ug via INTRAVENOUS
  Administered 2023-12-19 (×2): 50 ug via INTRAVENOUS

## 2023-12-19 MED ORDER — CHLORHEXIDINE GLUCONATE CLOTH 2 % EX PADS
6.0000 | MEDICATED_PAD | Freq: Every day | CUTANEOUS | Status: DC
  Administered 2023-12-20: 6 via TOPICAL

## 2023-12-19 MED ORDER — ASPIRIN 81 MG PO TBEC
81.0000 mg | DELAYED_RELEASE_TABLET | Freq: Every day | ORAL | Status: DC
  Administered 2023-12-20 – 2023-12-21 (×2): 81 mg via ORAL
  Filled 2023-12-19 (×2): qty 1

## 2023-12-19 MED ORDER — PHENOL 1.4 % MT LIQD: 1.0000 | OROMUCOSAL | Status: DC | PRN

## 2023-12-19 MED ORDER — CHLORHEXIDINE GLUCONATE CLOTH 2 % EX PADS: 6.0000 | MEDICATED_PAD | Freq: Once | CUTANEOUS | Status: AC

## 2023-12-19 MED ORDER — NOREPINEPHRINE 4 MG/250ML-% IV SOLN
INTRAVENOUS | Status: DC | PRN
  Administered 2023-12-19: 15 ug/min via INTRAVENOUS

## 2023-12-19 MED ORDER — LACTATED RINGERS IV SOLN: INTRAVENOUS | Status: DC

## 2023-12-19 MED ORDER — PHENYLEPHRINE 80 MCG/ML (10ML) SYRINGE FOR IV PUSH (FOR BLOOD PRESSURE SUPPORT)
PREFILLED_SYRINGE | INTRAVENOUS | Status: DC | PRN
  Administered 2023-12-19 (×3): 80 ug via INTRAVENOUS
  Administered 2023-12-19: 240 ug via INTRAVENOUS
  Administered 2023-12-19: 80 ug via INTRAVENOUS

## 2023-12-19 MED ORDER — EPHEDRINE SULFATE-NACL 50-0.9 MG/10ML-% IV SOSY
PREFILLED_SYRINGE | INTRAVENOUS | Status: DC | PRN
  Administered 2023-12-19 (×3): 5 mg via INTRAVENOUS

## 2023-12-19 MED ORDER — ALBUMIN HUMAN 5 % IV SOLN: INTRAVENOUS | Status: DC | PRN

## 2023-12-19 MED ORDER — SODIUM CHLORIDE 0.9% IV SOLUTION: Freq: Once | INTRAVENOUS | Status: DC

## 2023-12-19 MED ORDER — ORAL CARE MOUTH RINSE: 15.0000 mL | Freq: Once | OROMUCOSAL | Status: AC

## 2023-12-19 MED ORDER — SUGAMMADEX SODIUM 200 MG/2ML IV SOLN
INTRAVENOUS | Status: DC | PRN
  Administered 2023-12-19: 300 mg via INTRAVENOUS

## 2023-12-19 MED ORDER — CEFAZOLIN SODIUM-DEXTROSE 2-4 GM/100ML-% IV SOLN
2.0000 g | INTRAVENOUS | Status: AC
  Administered 2023-12-19 (×2): 2 g via INTRAVENOUS

## 2023-12-19 MED ORDER — PROPOFOL 1000 MG/100ML IV EMUL
INTRAVENOUS | Status: AC
  Filled 2023-12-19: qty 100

## 2023-12-19 MED ORDER — ONDANSETRON HCL 4 MG/2ML IJ SOLN: 4.0000 mg | Freq: Four times a day (QID) | INTRAMUSCULAR | Status: DC | PRN

## 2023-12-19 MED ORDER — LIDOCAINE HCL (CARDIAC) PF 100 MG/5ML IV SOSY
PREFILLED_SYRINGE | INTRAVENOUS | Status: DC | PRN
Start: 2023-12-19 — End: 2023-12-19
  Administered 2023-12-19 (×2): 100 mg via INTRAVENOUS

## 2023-12-19 MED ORDER — ATORVASTATIN CALCIUM 20 MG PO TABS
20.0000 mg | ORAL_TABLET | Freq: Every morning | ORAL | Status: DC
  Administered 2023-12-20 – 2023-12-21 (×2): 20 mg via ORAL
  Filled 2023-12-19 (×3): qty 1

## 2023-12-19 MED ORDER — FENTANYL CITRATE (PF) 100 MCG/2ML IJ SOLN
25.0000 ug | INTRAMUSCULAR | Status: DC | PRN
  Administered 2023-12-19 (×2): 50 ug via INTRAVENOUS

## 2023-12-19 MED ORDER — IODIXANOL 320 MG/ML IV SOLN
INTRAVENOUS | Status: DC | PRN
  Administered 2023-12-19: 120 mL via INTRA_ARTERIAL

## 2023-12-19 MED ORDER — ROCURONIUM BROMIDE 100 MG/10ML IV SOLN
INTRAVENOUS | Status: DC | PRN
  Administered 2023-12-19 (×3): 20 mg via INTRAVENOUS
  Administered 2023-12-19: 60 mg via INTRAVENOUS

## 2023-12-19 MED ORDER — DEXAMETHASONE SODIUM PHOSPHATE 10 MG/ML IJ SOLN
INTRAMUSCULAR | Status: DC | PRN
Start: 2023-12-19 — End: 2023-12-19
  Administered 2023-12-19: 10 mg via INTRAVENOUS

## 2023-12-19 MED ORDER — HYDROMORPHONE HCL 1 MG/ML IJ SOLN: 1.0000 mg | Freq: Once | INTRAMUSCULAR | Status: DC | PRN

## 2023-12-19 MED ORDER — ACETAMINOPHEN 325 MG RE SUPP: 325.0000 mg | RECTAL | Status: DC | PRN

## 2023-12-19 MED ORDER — MIDAZOLAM HCL 2 MG/2ML IJ SOLN
INTRAMUSCULAR | Status: AC
Start: 2023-12-19 — End: 2023-12-19
  Filled 2023-12-19: qty 2

## 2023-12-19 MED ORDER — ESMOLOL HCL 100 MG/10ML IV SOLN
INTRAVENOUS | Status: DC | PRN
  Administered 2023-12-19: 30 mg via INTRAVENOUS

## 2023-12-19 MED ORDER — HEPARIN (PORCINE) IN NACL 2000-0.9 UNIT/L-% IV SOLN
INTRAVENOUS | Status: DC | PRN
  Administered 2023-12-19: 2000 mL

## 2023-12-19 MED ORDER — METOPROLOL TARTRATE 5 MG/5ML IV SOLN: 2.5000 mg | INTRAVENOUS | Status: DC | PRN

## 2023-12-19 MED ORDER — CHLORHEXIDINE GLUCONATE CLOTH 2 % EX PADS
6.0000 | MEDICATED_PAD | Freq: Once | CUTANEOUS | Status: AC
  Administered 2023-12-19: 6 via TOPICAL

## 2023-12-19 MED ORDER — POLYETHYLENE GLYCOL 3350 17 G PO PACK: 17.0000 g | PACK | Freq: Every day | ORAL | Status: DC | PRN

## 2023-12-19 MED ORDER — POTASSIUM CHLORIDE CRYS ER 20 MEQ PO TBCR: 40.0000 meq | EXTENDED_RELEASE_TABLET | Freq: Every day | ORAL | Status: DC | PRN

## 2023-12-19 MED ORDER — CHLORHEXIDINE GLUCONATE 0.12 % MT SOLN
15.0000 mL | Freq: Once | OROMUCOSAL | Status: AC
  Administered 2023-12-19: 15 mL via OROMUCOSAL
  Filled 2023-12-19: qty 15

## 2023-12-19 MED ORDER — ACETAMINOPHEN 325 MG PO TABS: 325.0000 mg | ORAL_TABLET | ORAL | Status: DC | PRN

## 2023-12-19 MED ORDER — ALBUMIN HUMAN 5 % IV SOLN
INTRAVENOUS | Status: AC
  Filled 2023-12-19: qty 500

## 2023-12-19 MED ORDER — DOCUSATE SODIUM 100 MG PO CAPS
100.0000 mg | ORAL_CAPSULE | Freq: Every day | ORAL | Status: DC
  Administered 2023-12-20 – 2023-12-21 (×2): 100 mg via ORAL
  Filled 2023-12-19 (×3): qty 1

## 2023-12-19 MED ORDER — CLOPIDOGREL BISULFATE 75 MG PO TABS
75.0000 mg | ORAL_TABLET | Freq: Every day | ORAL | Status: DC
  Administered 2023-12-20 – 2023-12-21 (×2): 75 mg via ORAL
  Filled 2023-12-19 (×2): qty 1

## 2023-12-19 MED ORDER — VASOPRESSIN 20 UNIT/ML IV SOLN
INTRAVENOUS | Status: DC | PRN
  Administered 2023-12-19: 8 [IU] via INTRAVENOUS
  Administered 2023-12-19 (×2): 2 [IU] via INTRAVENOUS

## 2023-12-19 MED ORDER — SODIUM CHLORIDE 0.9 % IV SOLN: INTRAVENOUS | Status: AC

## 2023-12-19 MED ORDER — HEPARIN SODIUM (PORCINE) 1000 UNIT/ML IJ SOLN
INTRAMUSCULAR | Status: DC | PRN
  Administered 2023-12-19: 7000 [IU] via INTRAVENOUS

## 2023-12-19 MED ORDER — CEFAZOLIN SODIUM-DEXTROSE 2-4 GM/100ML-% IV SOLN
2.0000 g | Freq: Three times a day (TID) | INTRAVENOUS | Status: AC
  Administered 2023-12-19 – 2023-12-20 (×2): 2 g via INTRAVENOUS
  Filled 2023-12-19 (×2): qty 100

## 2023-12-19 MED ORDER — FENTANYL CITRATE (PF) 100 MCG/2ML IJ SOLN
INTRAMUSCULAR | Status: AC
Start: 2023-12-19 — End: 2023-12-19
  Filled 2023-12-19: qty 2

## 2023-12-19 MED ORDER — PANTOPRAZOLE SODIUM 40 MG PO TBEC
40.0000 mg | DELAYED_RELEASE_TABLET | Freq: Every day | ORAL | Status: DC
  Administered 2023-12-19 – 2023-12-21 (×3): 40 mg via ORAL
  Filled 2023-12-19 (×4): qty 1

## 2023-12-19 MED ORDER — OXYCODONE HCL 5 MG PO TABS: 5.0000 mg | ORAL_TABLET | ORAL | Status: DC | PRN

## 2023-12-19 MED ORDER — MIDAZOLAM HCL 2 MG/2ML IJ SOLN
INTRAMUSCULAR | Status: DC | PRN
  Administered 2023-12-19: 2 mg via INTRAVENOUS

## 2023-12-19 MED ORDER — DROPERIDOL 2.5 MG/ML IJ SOLN: 0.6250 mg | Freq: Once | INTRAMUSCULAR | Status: DC | PRN

## 2023-12-19 MED ORDER — FENTANYL CITRATE (PF) 100 MCG/2ML IJ SOLN
INTRAMUSCULAR | Status: AC
  Filled 2023-12-19: qty 2

## 2023-12-19 MED ORDER — SUCCINYLCHOLINE CHLORIDE 200 MG/10ML IV SOSY
PREFILLED_SYRINGE | INTRAVENOUS | Status: DC | PRN
  Administered 2023-12-19: 100 mg via INTRAVENOUS

## 2023-12-19 MED ORDER — MORPHINE SULFATE (PF) 2 MG/ML IV SOLN: 2.0000 mg | INTRAVENOUS | Status: DC | PRN

## 2023-12-19 MED ORDER — SORBITOL 70 % SOLN: 30.0000 mL | Freq: Every day | Status: DC | PRN

## 2023-12-19 MED ORDER — PROPOFOL 10 MG/ML IV BOLUS
INTRAVENOUS | Status: DC | PRN
  Administered 2023-12-19: 200 mg via INTRAVENOUS

## 2023-12-19 MED ORDER — LABETALOL HCL 5 MG/ML IV SOLN: 10.0000 mg | INTRAVENOUS | Status: DC | PRN

## 2023-12-19 MED ORDER — PHENYLEPHRINE HCL-NACL 20-0.9 MG/250ML-% IV SOLN
INTRAVENOUS | Status: DC | PRN
  Administered 2023-12-19: 25 ug/min via INTRAVENOUS

## 2023-12-19 MED ORDER — CEFAZOLIN SODIUM-DEXTROSE 2-4 GM/100ML-% IV SOLN
INTRAVENOUS | Status: AC
  Filled 2023-12-19: qty 100

## 2023-12-19 MED ORDER — SODIUM CHLORIDE 0.9 % IV SOLN
INTRAVENOUS | Status: DC | PRN
Start: 2023-12-19 — End: 2023-12-19

## 2023-12-19 MED ORDER — ONDANSETRON HCL 4 MG/2ML IJ SOLN
INTRAMUSCULAR | Status: DC | PRN
Start: 2023-12-19 — End: 2023-12-19
  Administered 2023-12-19 (×2): 4 mg via INTRAVENOUS

## 2023-12-19 MED ORDER — FLEET ENEMA RE ENEM: 1.0000 | ENEMA | Freq: Once | RECTAL | Status: DC | PRN

## 2023-12-19 SURGICAL SUPPLY — 54 items
BALLOON ATG 12X4X80 (BALLOONS) IMPLANT
BALLOON DORADO 10X80X80 (BALLOONS) IMPLANT
CATH ACCU-VU SIZ PIG 5F 70CM (CATHETERS) IMPLANT
CATH BALLN CODA 9X100X32 (BALLOONS) IMPLANT
CATH BEACON 5 .035 40 KMP TP (CATHETERS) IMPLANT
CATH BEACON 5 .035 65 KMP TIP (CATHETERS) IMPLANT
CATH MICROCATH PRGRT 2.8F 110 (CATHETERS) IMPLANT
CATH TEMPO 5F RIM 65CM (CATHETERS) IMPLANT
CATH VS15FR (CATHETERS) IMPLANT
CLEANSER WND VASHE 34 (WOUND CARE) IMPLANT
CLOSURE PERCLOSE PROSTYLE (VASCULAR PRODUCTS) IMPLANT
COIL 400 COMPLEX SOFT 6X20CM (Vascular Products) IMPLANT
COIL 400 COMPLEX SOFT 6X30CM (Vascular Products) IMPLANT
COVER DRAPE FLUORO 36X44 (DRAPES) IMPLANT
COVER PROBE ULTRASOUND 5X96 (MISCELLANEOUS) IMPLANT
COVER SURGICAL LIGHT HANDLE (MISCELLANEOUS) IMPLANT
DERMABOND ADVANCED .7 DNX12 (GAUZE/BANDAGES/DRESSINGS) IMPLANT
DEVICE PRESTO INFLATION (MISCELLANEOUS) IMPLANT
DEVICE TORQUE (MISCELLANEOUS) IMPLANT
DEVICE VASC CLSR CELT ART 6 (Vascular Products) IMPLANT
DEVICE VASC CLSR CELT ART 7 (Vascular Products) IMPLANT
DRESSING SURGICEL FIBRLLR 1X2 (HEMOSTASIS) IMPLANT
ELECT CAUTERY BLADE 6.4 (BLADE) IMPLANT
ELECTRODE REM PT RTRN 9FT ADLT (ELECTROSURGICAL) IMPLANT
EXCLUDER TNK 28.5X14.5X18CM (Endovascular Graft) IMPLANT
GLIDEWIRE ADV .035X180CM (WIRE) IMPLANT
GLIDEWIRE STIFF .35X180X3 HYDR (WIRE) IMPLANT
GUIDEWIRE ADV .018X180CM (WIRE) IMPLANT
HANDLE DETACHMENT COIL (MISCELLANEOUS) IMPLANT
IV CATH 18G SAFETY (IV SOLUTION) IMPLANT
PACK ANGIOGRAPHY (CUSTOM PROCEDURE TRAY) ×1 IMPLANT
PACK BASIN MAJOR ARMC (MISCELLANEOUS) IMPLANT
SHEATH BRITE TIP 6FRX11 (SHEATH) IMPLANT
SHEATH BRITE TIP 8FRX11 (SHEATH) IMPLANT
SHEATH DRYSEAL FLEX 12FR 33CM (SHEATH) IMPLANT
SHEATH DRYSEAL FLEX 18FR 33CM (SHEATH) IMPLANT
SPONGE XRAY 4X4 16PLY STRL (MISCELLANEOUS) IMPLANT
STENT GRAFT CONTRALAT 16X12X10 (Vascular Products) IMPLANT
STENT GRAFT CONTRALAT 16X12X14 (Vascular Products) IMPLANT
STENT VIABAHN 13X10X120 (Permanent Stent) IMPLANT
STENT VIABAHN 13X5X120 (Permanent Stent) IMPLANT
SUT MNCRL AB 4-0 PS2 18 (SUTURE) IMPLANT
SUT PROLENE 5 0 RB 1 DA (SUTURE) IMPLANT
SUT PROLENE 6 0 BV (SUTURE) IMPLANT
SUT VIC AB 2-0 CT1 (SUTURE) IMPLANT
SUT VIC AB 3-0 SH 27X BRD (SUTURE) IMPLANT
SUT VICRYL+ 3-0 36IN CT-1 (SUTURE) IMPLANT
SUTURE MNCRL 4-0 27XMF (SUTURE) IMPLANT
SYR MEDRAD MARK 7 150ML (SYRINGE) IMPLANT
TRAY FOLEY SLVR 16FR LF STAT (SET/KITS/TRAYS/PACK) IMPLANT
TUBING CONTRAST HIGH PRESS 72 (TUBING) IMPLANT
WIRE AMPLATZ SSTIFF .035X260CM (WIRE) IMPLANT
WIRE G VAS 035X260 STIFF (WIRE) IMPLANT
WIRE J 3MM .035X145CM (WIRE) IMPLANT

## 2023-12-19 NOTE — Op Note (Signed)
 OPERATIVE NOTE   PROCEDURE: US  guidance for vascular access, bilateral femoral arteries Catheter placement into aorta from bilateral femoral approaches Catheter placement to right internal iliac artery from right femoral approach with selective right internal iliac artery angiogram Coil embolization of the right internal iliac artery with two 6 mm Ruby coils Placement of a 28 mm diameter by 18 cm length Gore Excluder Endoprosthesis main body right with a 12 mm diameter by 14 cm length left iliac contralateral limb Placement of a 12 mm diameter by 10 cm length right iliac extension limb into the right external iliac artery Placement of 13 mm diameter by 10 cm length and a 13 mm diameter by 5 cm length Viabahn stent in the left external iliac artery for treatment of occlusive disease Cutdown for surgical repair of bilateral femoral arteries after endovascular prosthesis placement  PRE-OPERATIVE DIAGNOSIS: AAA  POST-OPERATIVE DIAGNOSIS: same  SURGEON: Selinda Gu, MD and Cordella Shawl, MD - Co-surgeons  ANESTHESIA: general  ESTIMATED BLOOD LOSS: 1600 cc  FINDING(S): 1.  AAA  SPECIMEN(S):  none  INDICATIONS:   Jeremiah Villarreal is a 58 y.o. 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 The anatomy was suitable for endovascular repair if we were able to cross the left common iliac artery occlusion and add coil embolization of the right internal iliac artery with extension to the right external iliac artery.  Risks and benefits of repair in an endovascular fashion were discussed and informed consent was obtained. Co-surgeons are used to expedite the procedure and reduce operative time as bilateral work needs to be done.  DESCRIPTION: After obtaining full informed written consent, the patient was brought back to the operating room and placed supine upon the operating table.  The patient received IV antibiotics  prior to induction.  After obtaining adequate anesthesia, the patient was prepped and draped in the standard fashion for endovascular AAA repair.  We then began by gaining access to both femoral arteries with US  guidance with me working on the right and Dr. Shawl working on the left.  The femoral arteries were found to be patent and accessed without difficulty with a needle under ultrasound guidance without difficulty on each side and permanent images were recorded.  We then placed 2 proglide devices on each side in a pre-close fashion and placed 8 French sheaths. The patient was then given 7000 units of intravenous heparin .  I began by doing a retrograde angiogram from the right femoral sheath to evaluate the right internal iliac artery.  I then cannulated this with a V S1 catheter and then a Progreat microcatheter.  This was quite tedious due to the aneurysmal nature of the common iliac artery as well as the high-grade stenosis at the origin of the right internal iliac artery.  Ultimately I was able to get out into the internal iliac artery and after selective imaging began coil embolization.  A 6 mm diameter by 20 cm length and a 6 mm diameter by 30 cm length Ruby coil was then placed in the right internal iliac artery and successful coil embolization was confirmed.  The V S1 catheter was then removed and a stiff wire was placed into the aorta.  From the left side, Dr. Shawl was able to cross the thrombosed left common iliac artery aneurysm and confirm intraluminal flow in the aorta with a Kumpe catheter and a Glidewire.  The Pigtail catheter was  placed into the aorta from the left side. Using this image, we selected a 28 mm diameter by 18 cm length Main body device.  Over a stiff wire, an 26 French sheath was placed. The main body was then placed through the 18 French sheath. A Kumpe catheter was placed up the left side and a magnified image at the renal arteries was performed. The main body was then  deployed just below the lowest renal artery.  These came off it almost an equal location.  The Kumpe catheter was used to cannulate the contralateral gate without difficulty and successful cannulation was confirmed by twirling the pigtail catheter in the main body. We then placed a stiff wire and a retrograde arteriogram was performed through the left femoral sheath. We upsized to the 12 [place] sheath for the contralateral limb and a 12 mm diameter by 14 cm length left iliac limb was selected and deployed.  This came down to the distal left common iliac artery but there remained occlusive disease in the left external iliac artery.  We elected to treat this with Viabahn stents.  A 13 mm diameter by 10 cm length Viabahn stent was deployed initially, but an additional 13 mm diameter by 5 cm length Viabahn stent was taken down to just above the left femoral head to completely encompass the area of disease.  These were postdilated with a 10 mm and then a 12 mm balloon and the Coda balloon was used for the junction points of the stent graft.  The main body deployment was then completed. Based off the angiographic findings, extension limbs were necessary.  A 12 mm diameter by 10 cm length right iliac extension limb was taken down about 4 to 5 cm into the right external iliac artery to gain seal due to the aneurysmal degeneration of the right common iliac artery down to its terminus. All junction points and seals zones were treated with the compliant balloon. The pigtail catheter was then replaced and a completion angiogram was performed.  No obvious endoleak was detected on completion angiography. The renal arteries were found to be widely patent.  The right internal iliac artery had been successfully coil embolized and excluded.  The left iliac system had been successfully revascularized from occlusion.. At this point we elected to terminate the procedure.  Initially, we secured the pro glide devices for hemostasis on the  femoral arteries.  Despite placing additional ProGlide devices, hemostasis could not be achieved on either side and we elected to cutdown and repair the femoral arteries directly.  The right side was repaired first.  A vertical incision was created overlying the femoral arteries we dissected down to the femoral arteries.  5-0 Prolene sutures were used to repair the artery wall.  Interestingly, the Pro-glide devices had been in location, but there was still hemorrhage from around the Pro-glide devices on the right side.  Once hemostasis was achieved, Vashe irrigation was used to irrigate the wound.  Fibrillar was placed.  The wound was then closed with 2 layers of 2-0 Vicryl, 3-0 Vicryl, and the skin incision was closed with 4-0 Monocryl.  On the left side, we similarly cut down to the left femoral artery with a vertical incision.  The artery was identified and again the Pro-glide devices were on the artery but did not have hemostasis.  5-0 Prolene sutures were used in an interrupted fashion to repair the artery and achieve hemostasis.  Once hemostasis was achieved, Vashe irrigation was again used to  irrigate the wound.  Fibrillar was placed.  2 layers of 2-0 Vicryl and 1 layer of 3-0 Vicryl were then placed.  The skin incision was closed with a 4-0 Monocryl. Dermabond and pressure dressing were placed. The patient was taken to the recovery room in stable condition having tolerated the procedure well.  COMPLICATIONS: none  CONDITION: stable  Selinda Gu  12/19/2023, 11:53 AM   This note was created with Dragon Medical transcription system. Any errors in dictation are purely unintentional.

## 2023-12-19 NOTE — Plan of Care (Signed)
  Problem: Education: Goal: Knowledge of General Education information will improve Description: Including pain rating scale, medication(s)/side effects and non-pharmacologic comfort measures Outcome: Progressing   Problem: Clinical Measurements: Goal: Ability to maintain clinical measurements within normal limits will improve Outcome: Progressing Goal: Will remain free from infection Outcome: Progressing Goal: Diagnostic test results will improve Outcome: Progressing Goal: Cardiovascular complication will be avoided Outcome: Progressing   Problem: Activity: Goal: Risk for activity intolerance will decrease Outcome: Progressing   Problem: Nutrition: Goal: Adequate nutrition will be maintained Outcome: Progressing   Problem: Elimination: Goal: Will not experience complications related to bowel motility Outcome: Progressing   Problem: Pain Managment: Goal: General experience of comfort will improve and/or be controlled Outcome: Progressing   Problem: Safety: Goal: Ability to remain free from injury will improve Outcome: Progressing

## 2023-12-19 NOTE — Interval H&P Note (Signed)
 History and Physical Interval Note:  12/19/2023 8:01 AM  Jeremiah Villarreal  has presented today for surgery, with the diagnosis of AAA includes iliac branch device   GORE    AAA repair.  The various methods of treatment have been discussed with the patient and family. After consideration of risks, benefits and other options for treatment, the patient has consented to  Procedure(s): ENDOVASCULAR REPAIR/STENT GRAFT (N/A) as a surgical intervention.  The patient's history has been reviewed, patient examined, no change in status, stable for surgery.  I have reviewed the patient's chart and labs.  Questions were answered to the patient's satisfaction.     [name]

## 2023-12-19 NOTE — Op Note (Signed)
 OPERATIVE NOTE   PROCEDURE: US  guidance for vascular access, bilateral femoral arteries Catheter placement into aorta from bilateral femoral approaches Catheter placement into right internal iliac artery with selective angiography of the right internal iliac artery. Coil embolization of the right internal iliac artery with two 6 mm Ruby coils Placement of a 28 x by 14 x 18 C3 Gore Excluder Endoprosthesis main body with a 12 x 14 contralateral limb Placement of a 13 x 10 and a 13 x 5 Viabahn left external iliac artery Placement of a 12 x 10 extension iliac limb into the right external iliac artery ProGlide closure devices bilateral femoral arteries  PRE-OPERATIVE DIAGNOSIS: AAA associated with severe atherosclerotic changes left lower extremity greater than right  POST-OPERATIVE DIAGNOSIS: same  SURGEON: Cordella Shawl, MD and Selinda Gu, MD - Co-surgeons  ANESTHESIA: general  ESTIMATED BLOOD LOSS: 1600 cc  FINDING(S): 1.  AAA associated with occlusion of the left common iliac artery and critical stenosis of the left external iliac artery.  SPECIMEN(S):  none  INDICATIONS:   Jeremiah Villarreal is a 58 y.o. y.o. male who presents with presents with an abdominal aortic aneurysm that is greater than 5 cm placing him at risk for lethal rupture.  The anatomy is suitable for endovascular repair.  Risks and benefits for repair of the abdominal aortic aneurysm using an endograft was described in detail to the patient all questions have been answered patient agrees to proceed.  Co-surgeons are utilized to expedite the procedure and reduce operative time improving patient's safety and improving outcome.  DESCRIPTION: After obtaining full informed written consent, the patient was brought back to the operating room and placed supine upon the operating table.  The patient received IV antibiotics prior to induction.  After obtaining adequate anesthesia, the patient was prepped and draped in the  standard fashion for endovascular AAA repair.  Co-surgeons are required because this is a complex bilateral procedure with work being performed simultaneously from both the right femoral and left femoral approach.  This also expedites the procedure making a shorter operative time reducing complications and improving patient safety.  We then began by gaining access to both femoral arteries with US  guidance with me working on the patient's left and Dr. Gu working on the patient's right.  The femoral arteries were found to be patent and accessed without difficulty with a needle under ultrasound guidance without difficulty on each side and permanent images were recorded.  We then placed 2 proglide devices on each side in a pre-close fashion and placed 8 French sheaths.  The patient was then given 7000 units of intravenous heparin .   With Dr. Gu working on the right side using a combination of a rim and a V S1 catheter and several different wires the right internal iliac artery was cannulated.  This was very difficult given the greater than 90% stenosis at the origin.  Progreat catheter was then advanced into the internal iliac on the right and injection through the micro catheter was performed.  Based on this image a 6 mm x 20 cm soft Ruby coil followed by a 6 mm x 30 cm soft Ruby coil was then placed extending the coils all the way up to the origin effectively occluding the internal iliac at its origin.  This was verified by hand-injection of contrast within the common iliac artery through the V S1 catheter.  Next working from the left side using a combination of an advantage wire as well as a stiff  angled Glidewire and a Kumpe catheter the iliac disease and the external and then the occlusion of the common iliac was achieved hand-injection of contrast through the Kumpe catheter verified intraluminal positioning within the aorta.  An Amplatz wire was then advanced through the Kumpe catheter and subsequently  the 8 French sheath in the left was exchanged for a 12 French sheath which was advanced to that the tip was within the aortic aneurysm.  The Pigtail catheter was placed into the aorta from the left side and an image up by the renal arteries was obtained. Using this image, we selected a 28 x 14 x 18 C3 Main body device.  Over a stiff wire, an 58 French sheath was placed. The main body was then placed through the 18 French sheath. The main body was then deployed just below the lowest renal artery. The Kumpe catheter was used to cannulate the contralateral gate without difficulty and successful cannulation was confirmed by twirling the pigtail catheter in the main body. We then placed a stiff wire and a retrograde arteriogram was performed through the left femoral sheath.  A 12 x 14 contralateral iliac extender limb was selected and deployed on the left. The main body deployment was then completed. Based off the angiographic findings, extension limbs were necessary bilaterally.  Obtaining a magnified oblique image on the left a 13 mm x 10 cm Viabahn was selected and deployed overlapping the left iliac limb.  This was then postdilated with a 10 mm [place] balloon multiple inflations were performed up to 16 atm.  Follow-up imaging demonstrated the distal portion was undersized and we had not covered all of the disease so a second Viabahn which was a 13 mm x 5 cm Viabahn was then deployed extending the left side down to the level of the circumflex arteries.  This was again postdilated with the 10 mm [place] balloon but this remained undersized and therefore a 12 mm x 40 mm Atlas balloon was advanced across this 1 area and inflated to approximately 14 atm for 1 minute.  Follow-up imaging now demonstrated wide patency of the left limb with treatment of the external iliac occlusive disease and less than 10% residual stenosis.  Attention was then turned to the right side where an LAO projection was obtained and magnified  imaging.  A 12 mm x 10 mm iliac extender limb was then selected and deployed overlapping the main body and extending into the external iliac by about 4 cm.  Both sides were then treated with the [facility] balloon and all junction points and seals zones were angioplastied with the compliant balloon.   The pigtail catheter was then replaced and a completion angiogram was performed.   No endoleak was detected on completion angiography. The renal arteries were found to be widely patent.  There is less than 10% residual stenosis throughout the iliac system on the left.   At this point we elected to terminate the procedure. We tried to secure the pro glide devices for hemostasis on the femoral arteries.  However we did not achieve hemostasis.  At 1 point Celt devices were also attempted but again we did not achieve hemostasis.  Beginning on the left side cutdown was performed with a 15 blade scalpel and subsequently the soft tissue was dissected with Bovie cautery.  The right common femoral artery was exposed and then clamped proximally and distally with angled [place]arty vascular clamps.  Several pursestring sutures of 5-0 Prolene were then used to achieve hemostasis.  Initially we thought we had hemostasis on the left after the Perclose's in conjunction with a 7 [place] Celt however once we have completed the repair of the femoral artery on the right we noted persistent bleeding on the left and elected to cut down on this side.  Again 15 blade scalpel was used to make the skin incision dissection was carried down through the soft tissues with Bovie cautery.  The artery was then exposed and hemostasis secured with interrupted 5-0 Prolene.  Both incisions were then irrigated with Vashe.  Fibrillar was also placed in the wound.  Both incisions were then closed in the usual fashion using 2 layers of 2-0 Vicryl followed by 2 layers of 3-0 Vicryl and then the skin incision was closed with a 4-0 Monocryl. Dermabond and pressure  dressing were placed. The patient was taken to the recovery room in stable condition having tolerated the procedure well.  COMPLICATIONS: none  CONDITION: stable  Cordella Shawl  12/19/2023, 12:35 PM

## 2023-12-19 NOTE — Transfer of Care (Signed)
 Immediate Anesthesia Transfer of Care Note  Patient: Jeremiah Villarreal  Procedure(s) Performed: ENDOVASCULAR REPAIR/STENT GRAFT  Patient Location: PACU  Anesthesia Type:General  Level of Consciousness: awake, drowsy, and patient cooperative  Airway & Oxygen Therapy: Patient Spontanous Breathing and Patient connected to face mask oxygen  Post-op Assessment: Report given to RN and Post -op Vital signs reviewed and stable  Post vital signs: Reviewed and stable  Last Vitals:  Vitals Value Taken Time  BP 138/90 12/19/23 12:30  Temp    Pulse 69 12/19/23 12:37  Resp 18 12/19/23 12:37  SpO2 100 % 12/19/23 12:37  Vitals shown include unfiled device data.  Last Pain:  Vitals:   12/19/23 0711  TempSrc: Oral  PainSc: 0-No pain         Complications: No notable events documented.

## 2023-12-19 NOTE — Anesthesia Procedure Notes (Signed)
 Procedure Name: Intubation Date/Time: 12/19/2023 8:47 AM  Performed by: Ledora Duncan, CRNAPre-anesthesia Checklist: Patient identified, Emergency Drugs available, Suction available and Patient being monitored Patient Re-evaluated:Patient Re-evaluated prior to induction Oxygen Delivery Method: Circle system utilized Preoxygenation: Pre-oxygenation with 100% oxygen Induction Type: IV induction and Cricoid Pressure applied Ventilation: Mask ventilation without difficulty Laryngoscope Size: McGrath and 3 Grade View: Grade II Tube type: Oral Tube size: 7.0 mm Number of attempts: 1 Airway Equipment and Method: Stylet and Oral airway Placement Confirmation: ETT inserted through vocal cords under direct vision, positive ETCO2 and breath sounds checked- equal and bilateral Secured at: 21 cm Tube secured with: Tape Dental Injury: Teeth and Oropharynx as per pre-operative assessment

## 2023-12-19 NOTE — Anesthesia Preprocedure Evaluation (Signed)
 Anesthesia Evaluation  Patient identified by MRN, date of birth, ID band Patient awake    Reviewed: Allergy & Precautions, H&P , NPO status , Patient's Chart, lab work & pertinent test results, reviewed documented beta blocker date and time   History of Anesthesia Complications Negative for: history of anesthetic complications  Airway Mallampati: II  TM Distance: >3 FB Neck ROM: full    Dental  (+) Poor Dentition, Chipped, Dental Advidsory Given   Pulmonary neg shortness of breath, COPD, neg recent URI, Current SmokerPatient did not abstain from smoking.   Pulmonary exam normal        Cardiovascular Exercise Tolerance: Good hypertension, On Medications (-) angina + Peripheral Vascular Disease  (-) Past MI and (-) Cardiac Stents Normal cardiovascular exam(-) dysrhythmias (-) Valvular Problems/Murmurs Rhythm:regular Rate:Normal     Neuro/Psych negative neurological ROS  negative psych ROS   GI/Hepatic negative GI ROS, Neg liver ROS,,,  Endo/Other  negative endocrine ROS    Renal/GU negative Renal ROS  negative genitourinary   Musculoskeletal   Abdominal Normal abdominal exam  (+)   Peds  Hematology negative hematology ROS (+)   Anesthesia Other Findings Past Medical History: No date: Hypertension  AAA (abdominal aortic aneurysm) (HCC) Acute diverticulitis Aortic aneurysm (HCC) Colovesical fistula Essential hypertension 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, left (HCC)    Reproductive/Obstetrics negative OB ROS                              Anesthesia Physical Anesthesia Plan  ASA: 2  Anesthesia Plan: General   Post-op Pain Management: Minimal or no pain anticipated   Induction: Intravenous  PONV Risk Score and Plan: 2 and Ondansetron , Dexamethasone , Midazolam  and Treatment may vary due to age or medical condition  Airway Management  Planned: Oral ETT  Additional Equipment: Arterial line  Intra-op Plan:   Post-operative Plan: Extubation in OR  Informed Consent: I have reviewed the patients History and Physical, chart, labs and discussed the procedure including the risks, benefits and alternatives for the proposed anesthesia with the patient or authorized representative who has indicated his/her understanding and acceptance.     Dental Advisory Given  Plan Discussed with: CRNA  Anesthesia Plan Comments: (Arterial line provided by vascular, we will transduce from one of the sheaths.)        Anesthesia Quick Evaluation

## 2023-12-20 DIAGNOSIS — I714 Abdominal aortic aneurysm, without rupture, unspecified: Secondary | ICD-10-CM | POA: Diagnosis not present

## 2023-12-20 LAB — TYPE AND SCREEN
ABO/RH(D): O POS
Antibody Screen: NEGATIVE
Unit division: 0
Unit division: 0
Unit division: 0
Unit division: 0

## 2023-12-20 LAB — BASIC METABOLIC PANEL WITH GFR
Anion gap: 7 (ref 5–15)
BUN: 15 mg/dL (ref 6–20)
CO2: 25 mmol/L (ref 22–32)
Calcium: 8.4 mg/dL — ABNORMAL LOW (ref 8.9–10.3)
Chloride: 108 mmol/L (ref 98–111)
Creatinine, Ser: 0.87 mg/dL (ref 0.61–1.24)
GFR, Estimated: >60 mL/min (ref >60)
Glucose, Bld: 149 mg/dL — ABNORMAL HIGH (ref 70–99)
Potassium: 4.4 mmol/L (ref 3.5–5.1)
Sodium: 140 mmol/L (ref 135–145)

## 2023-12-20 LAB — BPAM RBC
Blood Product Expiration Date: 202509092359
Blood Product Expiration Date: 202509092359
Blood Product Expiration Date: 202509092359
Blood Product Expiration Date: 202509092359
ISSUE DATE / TIME: 202508061057
ISSUE DATE / TIME: 202508061057
ISSUE DATE / TIME: 202508061126
ISSUE DATE / TIME: 202508061126
Unit Type and Rh: 5100
Unit Type and Rh: 5100
Unit Type and Rh: 5100
Unit Type and Rh: 5100

## 2023-12-20 LAB — CBC
HCT: 37.3 % — ABNORMAL LOW (ref 39.0–52.0)
Hemoglobin: 13.1 g/dL (ref 13.0–17.0)
MCH: 34.6 pg — ABNORMAL HIGH (ref 26.0–34.0)
MCHC: 35.1 g/dL (ref 30.0–36.0)
MCV: 98.4 fL (ref 80.0–100.0)
Platelets: 100 K/uL — ABNORMAL LOW (ref 150–400)
RBC: 3.79 MIL/uL — ABNORMAL LOW (ref 4.22–5.81)
RDW: 17.8 % — ABNORMAL HIGH (ref 11.5–15.5)
WBC: 13.5 K/uL — ABNORMAL HIGH (ref 4.0–10.5)
nRBC: 0 % (ref 0.0–0.2)

## 2023-12-20 LAB — PREPARE FRESH FROZEN PLASMA: Unit division: 0

## 2023-12-20 LAB — BPAM FFP
Blood Product Expiration Date: 202508082359
ISSUE DATE / TIME: 202508061139
Unit Type and Rh: 6200

## 2023-12-20 MED ORDER — THIAMINE HCL 100 MG/ML IJ SOLN: 200.0000 mg | Freq: Three times a day (TID) | INTRAVENOUS | Status: DC

## 2023-12-20 MED ORDER — DIAZEPAM 5 MG/ML IJ SOLN
2.5000 mg | INTRAMUSCULAR | Status: DC | PRN
  Administered 2023-12-20 – 2023-12-21 (×2): 2.5 mg via INTRAVENOUS
  Filled 2023-12-20 (×2): qty 2

## 2023-12-20 MED ORDER — FOLIC ACID 1 MG PO TABS
1.0000 mg | ORAL_TABLET | Freq: Every day | ORAL | Status: DC
  Administered 2023-12-20 – 2023-12-21 (×2): 1 mg via ORAL
  Filled 2023-12-20 (×2): qty 1

## 2023-12-20 MED ORDER — ADULT MULTIVITAMIN W/MINERALS CH
1.0000 | ORAL_TABLET | Freq: Every day | ORAL | Status: DC
  Administered 2023-12-20 – 2023-12-21 (×2): 1 via ORAL
  Filled 2023-12-20 (×2): qty 1

## 2023-12-20 MED ORDER — THIAMINE HCL 100 MG/ML IJ SOLN
400.0000 mg | Freq: Three times a day (TID) | INTRAVENOUS | Status: DC
  Administered 2023-12-20 – 2023-12-21 (×3): 400 mg via INTRAVENOUS
  Filled 2023-12-20 (×4): qty 4

## 2023-12-20 MED ORDER — THIAMINE HCL 100 MG/ML IJ SOLN: 200.0000 mg | INTRAVENOUS | Status: DC

## 2023-12-20 MED ORDER — LORAZEPAM 1 MG PO TABS
1.0000 mg | ORAL_TABLET | Freq: Once | ORAL | Status: AC
  Administered 2023-12-20: 1 mg via ORAL
  Filled 2023-12-20: qty 1

## 2023-12-20 NOTE — Discharge Instructions (Signed)
   Call [facility]uit[facility] Get free tobacco cessation help 24/7 in several ways: 1-800-QUIT-NOW (714)224-2742); http://carroll-castaneda.info/  Tobacco Use You can quit smoking It's no secret that smoking cigarettes and using other tobacco products is bad for your health. According to the Centers for Disease Control and Prevention (CDC)*, smoking increases risk for serious health conditions including:  Numerous types of cancer Heart disease Chronic obstructive pulmonary disorder, or COPD Pregnancy complications and birth defects Even just being around cigarette smoke can be dangerous. Children who are exposed to secondhand smoke are especially vulnerable.  The good news is you can quit.  Make a plan to quit The CDC offers these tips:  Pick a quit date. Put it on your calendar and stick to it. Let people know. Tell friends and family of your plans to quit. They can help support you and hold you accountable. Get rid of all of your smoking stuff. Ditch your cigarettes, lighters, matches and ashtrays. Freshen up your car and living space, so that not even the smell of cigarettes is there to tempt you. Write down the reasons you want to quit. In a moment of weakness, it can be helpful to review your own reasons for why this change is important. Beware of your smoking triggers. Maybe you are in the habit of having a cigarette with your morning coffee. Consider adjusting your routine -- grab coffee in a caf or on the go -- and lighting up may seem less automatic. Make a plan for cravings. Some people may need to wean themselves off of nicotine  with patches or gum. Or perhaps a brisk walk, video game or handful of jellybeans is enough of a distraction to get your through. Have a support system. Call or text a friend who can help distract you or talk you down from the urge to light up. There are quit lines and quit smoking apps that can help, too. Reward yourself. Quitting smoking is a big deal. So  when you reach milestones, celebrate them. What about vaping? You may have heard about e-cigarettes, e-pens, e-pipes or vaporizers. These devices contain liquid cartridges that produce a vapor that users inhale -- or "vape."  Marketers have promoted these devices as a "safe" alternative to smoking. But most of these products do contain nicotine  and other potentially harmful chemicals. Scientists are still studying the long-term effects of these devices. The U.S. Food and Drug Administration (FDA) has not approved them as a smoking cessation aid.  *The CDC is an independent organization that offers health information that health plan members may find helpful.

## 2023-12-20 NOTE — Progress Notes (Signed)
  Progress Note    12/20/2023 9:25 AM 1 Day Post-Op  Subjective:  Caedyn Raygoza is a 58 yo male now POD #1 from:  PROCEDURE: US  guidance for vascular access, bilateral femoral arteries Catheter placement into aorta from bilateral femoral approaches Catheter placement to right internal iliac artery from right femoral approach with selective right internal iliac artery angiogram Coil embolization of the right internal iliac artery with two 6 mm Ruby coils Placement of a 28 mm diameter by 18 cm length Gore Excluder Endoprosthesis main body right with a 12 mm diameter by 14 cm length left iliac contralateral limb Placement of a 12 mm diameter by 10 cm length right iliac extension limb into the right external iliac artery Placement of 13 mm diameter by 10 cm length and a 13 mm diameter by 5 cm length Viabahn stent in the left external iliac artery for treatment of occlusive disease Cutdown for surgical repair of bilateral femoral arteries after endovascular prosthesis placement   Vitals:   12/20/23 0600 12/20/23 0800  BP: (!) 151/93   Pulse: 68   Resp: 15   Temp:  97.8 F (36.6 C)  SpO2: 99%    Physical Exam: Cardiac:  RRR with Tachycardia in 120's Normal S1, S2. No murmurs Lungs:  Course on auscultation throughout. Slight wheezing on deep inspiration.  Incisions:  Bilateral groin incisions. Closed with suture and Dermabond Extremities:  Bilateral lower extremities are warm to touch with doppler pulses bilaterally Abdomen:  Positive bowel sounds throughout, Soft, non tender and non distended  Neurologic: AAOX3, Answers questions and follows commands appropriately  CBC    Component Value Date/Time   WBC 13.5 (H) 12/20/2023 0413   RBC 3.79 (L) 12/20/2023 0413   HGB 13.1 12/20/2023 0413   HCT 37.3 (L) 12/20/2023 0413   PLT 100 (L) 12/20/2023 0413   MCV 98.4 12/20/2023 0413   MCH 34.6 (H) 12/20/2023 0413   MCHC 35.1 12/20/2023 0413   RDW 17.8 (H) 12/20/2023 0413   LYMPHSABS 2.4  12/10/2023 1118   MONOABS 0.7 12/10/2023 1118   EOSABS 0.2 12/10/2023 1118   BASOSABS 0.1 12/10/2023 1118    BMET    Component Value Date/Time   NA 140 12/20/2023 0413   K 4.4 12/20/2023 0413   CL 108 12/20/2023 0413   CO2 25 12/20/2023 0413   GLUCOSE 149 (H) 12/20/2023 0413   BUN 15 12/20/2023 0413   CREATININE 0.87 12/20/2023 0413   CALCIUM  8.4 (L) 12/20/2023 0413   GFRNONAA >60 12/20/2023 0413    INR    Component Value Date/Time   INR 1.2 12/19/2023 1429     Intake/Output Summary (Last 24 hours) at 12/20/2023 0925 Last data filed at 12/20/2023 0744 Gross per 24 hour  Intake 4107.27 ml  Output 3400 ml  Net 707.27 ml     Assessment/Plan:  58 y.o. male is s/p SEE ABOVE  1 Day Post-Op   PLAN Advance Diet as tolerated Pain medication PRN PT/OT consults.  Ambulate patient in halls today OOB to chair for 6 hours today.   DVT prophylaxis:  ASA 81 mg daily and Plavix  75 mg Daily.    Gwendlyn JONELLE Shank [facility] 12/20/2023 9:25 AM

## 2023-12-20 NOTE — Progress Notes (Signed)
 The patient refused to eat lunch today. The patient states that he does not like the food here and will not eat it. Made Gwendlyn Shank, NP aware and he said it was okay for son to bring the patient outside food.

## 2023-12-20 NOTE — Plan of Care (Signed)
  Problem: Education: Goal: Knowledge of General Education information will improve Description: Including pain rating scale, medication(s)/side effects and non-pharmacologic comfort measures Outcome: Progressing   Problem: Health Behavior/Discharge Planning: Goal: Ability to manage health-related needs will improve Outcome: Progressing   Problem: Clinical Measurements: Goal: Will remain free from infection Outcome: Progressing Goal: Respiratory complications will improve Outcome: Progressing Goal: Cardiovascular complication will be avoided Outcome: Progressing   Problem: Activity: Goal: Risk for activity intolerance will decrease Outcome: Progressing   Problem: Elimination: Goal: Will not experience complications related to urinary retention Outcome: Progressing   Problem: Pain Managment: Goal: General experience of comfort will improve and/or be controlled Outcome: Progressing   Problem: Safety: Goal: Ability to remain free from injury will improve Outcome: Progressing   Problem: Skin Integrity: Goal: Risk for impaired skin integrity will decrease Outcome: Progressing   Problem: Clinical Measurements: Goal: Postoperative complications will be avoided or minimized Outcome: Progressing   Problem: Respiratory: Goal: Will achieve and/or maintain a regular respiratory rate, without signs or symptoms of dyspnea Outcome: Progressing   Problem: Skin Integrity: Goal: Demonstration of wound healing without infection will improve Outcome: Progressing

## 2023-12-20 NOTE — TOC CM/SW Note (Signed)
..  Transition of Care [facility]) - Inpatient Brief Assessment   Patient Details  Name: Jeremiah Villarreal MRN: [id number] Date of Birth: [birth year]  Transition of Care [facility]) CM/SW Contact:    Edsel DELENA Fischer, LCSW Phone Number: 12/20/2023, 2:56 PM   Clinical Narrative:  SW added resources for alcohol and smoking to pts AVS  Transition of Care Asessment:

## 2023-12-20 NOTE — Anesthesia Postprocedure Evaluation (Signed)
 Anesthesia Post Note  Patient: Jeremiah Villarreal  Procedure(s) Performed: ENDOVASCULAR REPAIR/STENT GRAFT  Patient location during evaluation: ICU Anesthesia Type: General Level of consciousness: awake Pain management: pain level controlled Respiratory status: spontaneous breathing Cardiovascular status: stable Anesthetic complications: no   No notable events documented.   Last Vitals:  Vitals:   12/20/23 0500 12/20/23 0600  BP: (!) 160/96 (!) 151/93  Pulse: 64 68  Resp: 15 15  Temp:    SpO2: 98% 99%    Last Pain:  Vitals:   12/20/23 0400  TempSrc: Oral  PainSc: 0-No pain                 Shona Earnie Fare

## 2023-12-20 NOTE — Progress Notes (Signed)
 Ambulated patient around the nurses station this morning. The patients HR increased to 130s-140s and the patient had tremors in all of his extremities. Patient had to walk with the walker. Spoke with family and they said that patient was a big drinker at home and he was probably withdrawing. Made Gwendlyn Shank, NP aware. New orders placed.

## 2023-12-20 NOTE — Plan of Care (Signed)

## 2023-12-21 DIAGNOSIS — I714 Abdominal aortic aneurysm, without rupture, unspecified: Secondary | ICD-10-CM | POA: Diagnosis not present

## 2023-12-21 LAB — BASIC METABOLIC PANEL WITH GFR
Anion gap: 9 (ref 5–15)
BUN: 14 mg/dL (ref 6–20)
CO2: 27 mmol/L (ref 22–32)
Calcium: 8.6 mg/dL — ABNORMAL LOW (ref 8.9–10.3)
Chloride: 106 mmol/L (ref 98–111)
Creatinine, Ser: 0.8 mg/dL (ref 0.61–1.24)
GFR, Estimated: >60 mL/min (ref >60)
Glucose, Bld: 109 mg/dL — ABNORMAL HIGH (ref 70–99)
Potassium: 3.4 mmol/L — ABNORMAL LOW (ref 3.5–5.1)
Sodium: 142 mmol/L (ref 135–145)

## 2023-12-21 MED ORDER — OXYCODONE HCL 5 MG PO TABS: 5.0000 mg | ORAL_TABLET | ORAL | 0 refills | Status: AC | PRN

## 2023-12-21 MED ORDER — CLOPIDOGREL BISULFATE 75 MG PO TABS: 75.0000 mg | ORAL_TABLET | Freq: Every day | ORAL | 2 refills | Status: AC

## 2023-12-21 MED ORDER — ASPIRIN 81 MG PO TBEC: 81.0000 mg | DELAYED_RELEASE_TABLET | Freq: Every day | ORAL | 12 refills | Status: AC

## 2023-12-21 NOTE — Progress Notes (Signed)
 Patient discharged to home in stable condition and with all belongings.

## 2023-12-21 NOTE — Discharge Summary (Signed)
 [facility]    Discharge Summary    Patient ID:  Jeremiah Villarreal MRN: [id number] DOB/AGE: [birth year] 58 y.o.  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   a.) s/p perforated Hartman procedure with colostomy for perforated diverticulitis with colovesical fistula 01/2023   COPD (chronic obstructive pulmonary disease) (HCC)    Diverticulitis of colon with perforation    a.) s/p Hartman procedure with colostomy creation 01/2023   Hepatic steatosis    Hyperlipidemia    Hypertension    Iliac artery occlusion, left (HCC)    Infrarenal abdominal aortic aneurysm (AAA) without rupture (HCC)    a.) 5.5 cm; followed by vascular; plans for EVAR   Polyp of cecum    S/P colostomy (HCC)    S/P colostomy takedown     Procedure(s): ENDOVASCULAR REPAIR/STENT GRAFT PROCEDURE: US  guidance for vascular access, bilateral femoral arteries Catheter placement into aorta from bilateral femoral approaches Catheter placement to right internal iliac artery from right femoral approach with selective right internal iliac artery angiogram Coil embolization of the right internal iliac artery with two 6 mm Ruby coils Placement of a 28 mm diameter by 18 cm length Gore Excluder Endoprosthesis main body right with a 12 mm diameter by 14 cm length left iliac contralateral limb Placement of a 12 mm diameter by 10 cm length right iliac extension limb into the right external iliac  artery Placement of 13 mm diameter by 10 cm length and a 13 mm diameter by 5 cm length Viabahn stent in the left external iliac artery for treatment of occlusive disease Cutdown for surgical repair of bilateral femoral arteries after endovascular prosthesis placement  Discharged Condition: good  HPI:  Patient returns today in follow up of his abdominal aortic and iliac artery aneurysms.  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  We discussed repair at that time, but he had to have colocutaneous fistulas treated and ultimately got a diverting ostomy which was reversed about 3 months ago.  He has done well from this.  No signs of infection.  His bowel habits have returned to reasonably steady.  He had a CT scan done February 28 of this year which was almost 6 months from his original scan.  I have independently reviewed the scan as well.  This demonstrates growth of the aneurysm up to 5.5 cm in the abdominal aorta (even though the official report said the abdominal aorta was stable at 5.5 cm, that is 4 cm of growth in less than six months), and the right iliac artery aneurysm remains 3.5 to 3.6 cm in maximal diameter.  The left iliac artery remains occluded/thrombosed.   Hospital Course:  Kyzer Blowe is a 58 y.o. male is S/P Bilateral Groin incisions for AAA endovascular repair. Patient is recovering as expected.  Patient endorse that he drinks 5-6 beers at night.  Postoperatively patient was placed on  CIWA protocol as a precaution.  He got 1 dose of 5 mg of Valium  to help him sleep at night.  Otherwise no other withdrawal symptoms.  This morning he is alert and oriented x 4 answers all questions and follows commands appropriately.  Patient endorses his legs feel much better and he can feel his toes, something he has not been able to do for years.  Bilateral  groins were sutured closed and glued with Dermabond.  No signs or symptoms of hematoma seroma or infection to note today.  However he does have erythema to both groin areas which is expected.  Patient is ambulating the halls, urinating and eating well.  Patient to be discharged this morning.  Patient will be discharged on aspirin  81 mg daily Plavix  75 mg daily and Lipitor 20 mg daily.  Patient was counseled not to skip or miss taking any of his medications as it will interfere with the outcome of his procedure.  Patient verbalizes understanding.  I spent greater than 60 minutes on developing, teaching and implementing the patient's discharge today.   Extubated: POD # 0 Physical Exam:  Alert notes x3, no acute distress Face: Symmetrical.  Tongue is midline. Neck: Trachea is midline.  No swelling or bruising. Cardiovascular: Regular rate and rhythm Pulmonary: Clear to auscultation bilaterally Abdomen: Soft, nontender, nondistended Right groin access: Clean dry and intact.  No swelling or drainage noted Left groin access: Clean dry and intact.  No swelling or drainage noted Left lower extremity: Thigh soft.  Calf soft.  Extremities warm distally toes.  Hard to palpate pedal pulses however the foot is warm is her good capillary refill. Right lower extremity: Thigh soft.  Calf soft.  Extremities warm distally toes.  Hard to palpate pedal pulses however the foot is warm is her good capillary refill. Neurological: No deficits noted   Post-op wounds:  clean, dry, intact or healing well  Pt. Ambulating, voiding and taking PO diet without difficulty. Pt pain controlled with PO pain meds.  Labs:  As below  Complications: none  Consults:    Significant Diagnostic Studies: CBC Lab Results  Component Value Date   WBC 13.5 (H) 12/20/2023   HGB 13.1 12/20/2023   HCT 37.3 (L) 12/20/2023   MCV 98.4 12/20/2023   PLT 100 (L) 12/20/2023    BMET    Component Value Date/Time   NA 142  12/21/2023 0435   K 3.4 (L) 12/21/2023 0435   CL 106 12/21/2023 0435   CO2 27 12/21/2023 0435   GLUCOSE 109 (H) 12/21/2023 0435   BUN 14 12/21/2023 0435   CREATININE 0.80 12/21/2023 0435   CALCIUM  8.6 (L) 12/21/2023 0435   GFRNONAA >60 12/21/2023 0435   COAG Lab Results  Component Value Date   INR 1.2 12/19/2023   INR 1.1 01/21/2023   INR 1.1 01/19/2023     Disposition:  Discharge to :Home  Allergies as of 12/21/2023   No Known Allergies      Medication List     TAKE these medications    acetaminophen  500 MG tablet Commonly known as: TYLENOL  Take 1,000 mg by mouth every 6 (six) hours as needed (pain.).   amLODipine  5 MG tablet Commonly known as: NORVASC  Take 1 tablet (5 mg total) by mouth daily. What changed: when to take this   aspirin  EC 81 MG tablet Take 1 tablet (81 mg total) by mouth daily at 6 (six) AM. Swallow whole. Start taking on: December 22, 2023   atorvastatin   20 MG tablet Commonly known as: LIPITOR Take 20 mg by mouth in the morning.   clopidogrel  75 MG tablet Commonly known as: PLAVIX  Take 1 tablet (75 mg total) by mouth daily at 6 (six) AM. Start taking on: December 22, 2023   ibuprofen  600 MG tablet Commonly known as: ADVIL  Take 1 tablet (600 mg total) by mouth every 8 (eight) hours as needed for moderate pain (pain score 4-6).   methocarbamol  500 MG tablet Commonly known as: ROBAXIN  Take 1 tablet (500 mg total) by mouth every 8 (eight) hours as needed for muscle spasms.   olmesartan 40 MG tablet Commonly known as: BENICAR Take 40 mg by mouth in the morning.   oxyCODONE  5 MG immediate release tablet Commonly known as: Oxy IR/ROXICODONE  Take 1-2 tablets (5-10 mg total) by mouth every 4 (four) hours as needed for moderate pain (pain score 4-6) or severe pain (pain score 7-10) ([id number]).       Verbal and written Discharge instructions given to the patient. Wound care per Discharge AVS  Follow-up Information     Juergen Hardenbrook, Gwendlyn R, NP  Follow up in 2 week(s).   Specialty: Vascular Surgery Why: Post Op Follow up Bilateral Lower Arterial Duplex Ultrasounds with ABI's Contact information: [address] [place] [address] [phone]                 Signed: Gwendlyn JONELLE Shank, NP  12/21/2023, 9:58 AM

## 2023-12-21 NOTE — Plan of Care (Signed)
 Patient discharged home.

## 2023-12-21 NOTE — Plan of Care (Signed)
 Continuing with plan of care.

## 2023-12-23 NOTE — Anesthesia Postprocedure Evaluation (Deleted)
 Anesthesia Post Note  Patient: Jeremiah Villarreal  Procedure(s) Performed: ENDOVASCULAR REPAIR/STENT GRAFT  Patient location during evaluation: PACU Anesthesia Type: General Level of consciousness: awake and alert Pain management: pain level controlled Vital Signs Assessment: post-procedure vital signs reviewed and stable Respiratory status: spontaneous breathing, nonlabored ventilation, respiratory function stable and patient connected to nasal cannula oxygen Cardiovascular status: blood pressure returned to baseline and stable Postop Assessment: no apparent nausea or vomiting Anesthetic complications: no   No notable events documented.   Last Vitals:  Vitals:   12/21/23 1000 12/21/23 1100  BP: (!) 141/90 (!) 145/91  Pulse: 81 92  Resp: 19 18  Temp:    SpO2: 98% 97%    Last Pain:  Vitals:   12/21/23 0800  TempSrc: Oral  PainSc: 0-No pain                 Prentice Murphy

## 2023-12-24 ENCOUNTER — Encounter: Payer: Self-pay | Admitting: Vascular Surgery

## 2024-01-01 ENCOUNTER — Other Ambulatory Visit (INDEPENDENT_AMBULATORY_CARE_PROVIDER_SITE_OTHER): Payer: Self-pay | Admitting: Vascular Surgery

## 2024-01-01 ENCOUNTER — Telehealth (INDEPENDENT_AMBULATORY_CARE_PROVIDER_SITE_OTHER): Payer: Self-pay

## 2024-01-01 DIAGNOSIS — I714 Abdominal aortic aneurysm, without rupture, unspecified: Secondary | ICD-10-CM

## 2024-01-01 DIAGNOSIS — Z9889 Other specified postprocedural states: Secondary | ICD-10-CM

## 2024-01-01 DIAGNOSIS — Z8679 Personal history of other diseases of the circulatory system: Secondary | ICD-10-CM

## 2024-01-01 NOTE — Telephone Encounter (Signed)
 Patient daughter left a message stating that her father has possible infection at surgical site also  leg and foot swelling. Patient had endovascular repair/stent graft on 12/19/23 and  has scheduled appt on 01/04/24. I did reach out to the patient for more information but I was not able to leave a voicemail. Please Advise

## 2024-01-01 NOTE — Telephone Encounter (Signed)
 Attempted to contact the patient and patient daughter phone but was not able to leave a message. I will try to contact the patient again on tomorrow.

## 2024-01-01 NOTE — Telephone Encounter (Signed)
 Given the intervention the foot swelling is not uncommon.  Let's try to contact the patient and/or daughter again to evaluate the concerns

## 2024-01-02 NOTE — Telephone Encounter (Signed)
 Attempted to contact the patient but was not able to leave a voicemail

## 2024-01-04 ENCOUNTER — Encounter (INDEPENDENT_AMBULATORY_CARE_PROVIDER_SITE_OTHER)

## 2024-01-07 ENCOUNTER — Ambulatory Visit (INDEPENDENT_AMBULATORY_CARE_PROVIDER_SITE_OTHER): Admitting: Vascular Surgery

## 2024-04-07 ENCOUNTER — Telehealth: Payer: Self-pay | Admitting: Cardiovascular Disease

## 2024-04-07 NOTE — Telephone Encounter (Signed)
 Called and spoke with patient. 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 Patient asked to us  call [phone] to get the fax number when letter is ready.

## 2024-04-07 NOTE — Telephone Encounter (Signed)
 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

## 2024-04-08 ENCOUNTER — Encounter: Payer: Self-pay | Admitting: Emergency Medicine

## 2024-04-08 NOTE — Telephone Encounter (Signed)
 Spoke with patient and notified him of the following from Dr. Gollan.  We can write a letter clearing him from cardio perspective He may need a letter from vascular Thx TGollan  Patient verbalizes understanding. Letter faxed to [phone] per patient request.
